# Patient Record
Sex: Female | Born: 1950 | Race: White | Hispanic: No | Marital: Married | Smoking: Never smoker
Health system: Southern US, Community
[De-identification: ages and names within clinical notes are randomized; demographics above are authoritative.]

## PROBLEM LIST (undated history)

## (undated) DIAGNOSIS — M1990 Unspecified osteoarthritis, unspecified site: Secondary | ICD-10-CM

## (undated) DIAGNOSIS — G5600 Carpal tunnel syndrome, unspecified upper limb: Secondary | ICD-10-CM

## (undated) DIAGNOSIS — H269 Unspecified cataract: Secondary | ICD-10-CM

## (undated) DIAGNOSIS — H9190 Unspecified hearing loss, unspecified ear: Secondary | ICD-10-CM

## (undated) DIAGNOSIS — T8859XA Other complications of anesthesia, initial encounter: Secondary | ICD-10-CM

## (undated) DIAGNOSIS — I10 Essential (primary) hypertension: Secondary | ICD-10-CM

## (undated) DIAGNOSIS — T4145XA Adverse effect of unspecified anesthetic, initial encounter: Secondary | ICD-10-CM

## (undated) DIAGNOSIS — R011 Cardiac murmur, unspecified: Secondary | ICD-10-CM

## (undated) DIAGNOSIS — E785 Hyperlipidemia, unspecified: Secondary | ICD-10-CM

## (undated) DIAGNOSIS — R51 Headache: Secondary | ICD-10-CM

## (undated) DIAGNOSIS — T7840XA Allergy, unspecified, initial encounter: Secondary | ICD-10-CM

## (undated) DIAGNOSIS — R519 Headache, unspecified: Secondary | ICD-10-CM

## (undated) DIAGNOSIS — J302 Other seasonal allergic rhinitis: Secondary | ICD-10-CM

## (undated) HISTORY — PX: TONSILLECTOMY: SUR1361

## (undated) HISTORY — DX: Hyperlipidemia, unspecified: E78.5

## (undated) HISTORY — DX: Allergy, unspecified, initial encounter: T78.40XA

## (undated) HISTORY — DX: Cardiac murmur, unspecified: R01.1

## (undated) HISTORY — PX: COLONOSCOPY: SHX174

## (undated) HISTORY — PX: I & D EXTREMITY: SHX5045

## (undated) HISTORY — PX: TUBAL LIGATION: SHX77

## (undated) HISTORY — PX: JOINT REPLACEMENT: SHX530

---
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
## 2001-01-26 ENCOUNTER — Encounter: Payer: Self-pay | Admitting: Family Medicine

## 2001-01-26 ENCOUNTER — Encounter: Admission: RE | Admit: 2001-01-26 | Discharge: 2001-01-26 | Payer: Self-pay | Admitting: Family Medicine

## 2002-09-07 ENCOUNTER — Ambulatory Visit (HOSPITAL_COMMUNITY): Admission: RE | Admit: 2002-09-07 | Discharge: 2002-09-07 | Payer: Self-pay | Admitting: Gastroenterology

## 2002-11-26 ENCOUNTER — Encounter: Admission: RE | Admit: 2002-11-26 | Discharge: 2002-11-26 | Payer: Self-pay | Admitting: Family Medicine

## 2002-11-26 ENCOUNTER — Encounter: Payer: Self-pay | Admitting: Family Medicine

## 2006-08-30 ENCOUNTER — Encounter: Admission: RE | Admit: 2006-08-30 | Discharge: 2006-08-30 | Payer: Self-pay | Admitting: Family Medicine

## 2008-05-08 ENCOUNTER — Encounter: Admission: RE | Admit: 2008-05-08 | Discharge: 2008-05-08 | Payer: Self-pay | Admitting: Family Medicine

## 2009-04-26 HISTORY — PX: BUNIONECTOMY: SHX129

## 2009-05-05 ENCOUNTER — Ambulatory Visit (HOSPITAL_COMMUNITY): Admission: RE | Admit: 2009-05-05 | Discharge: 2009-05-05 | Payer: Self-pay | Admitting: Family Medicine

## 2009-07-27 ENCOUNTER — Emergency Department (HOSPITAL_COMMUNITY): Admission: EM | Admit: 2009-07-27 | Discharge: 2009-07-27 | Payer: Self-pay | Admitting: Emergency Medicine

## 2010-09-11 NOTE — Op Note (Signed)
   NAME:  Summer Cooper, Summer Cooper                       ACCOUNT NO.:  [id number]   MEDICAL RECORD NO.:  [id number]                   PATIENT TYPE:  AMB   LOCATION:  ENDO                                 FACILITY:  [facility]   PHYSICIAN:  Anselmo Rod, M.D.               DATE OF BIRTH:  09/12/1950   DATE OF PROCEDURE:  09/07/2002  DATE OF DISCHARGE:                                 OPERATIVE REPORT   PROCEDURE:  Screening colonoscopy.   ENDOSCOPIST:  Anselmo Rod, M.D.   INSTRUMENT USED:  Olympus video colonoscope.   INDICATIONS FOR PROCEDURE:  This is a 60 year old white female who is  undergoing screening colonoscopy.  The patient has a history of constipation  and very irregular, occasional __________ bowel movement.  Rule out polyps,  hemorrhoids, etc.   CONSENT/PREPARATION:  Informed consent was procured from the patient.  The  patient fasted for eight hours prior to the procedure and prepped with a  bottle of magnesium citrate and a gallon of GoLYTELY the night prior to the  procedure.   PREPROCEDURE PHYSICAL EXAMINATION:  VITAL SIGNS:  The patient had stable  vital signs.  NECK:  Supple.  CHEST:  Clear to auscultation.  HEART:  S1 and S2 regular.  ABDOMEN:  Soft with normal bowel sounds.   DESCRIPTION OF PROCEDURE:  The patient was placed in the left lateral  decubitus position, sedated with 70 mg of Demerol and 7 mg of Versed  intravenously.  Once the patient was adequately sedated and maintained on  low flow oxygen, continuous cardiac monitoring, the Olympus video  colonoscope was advanced from the rectum to the cecum and terminal ileum  without difficulty.  The appendiceal orifice and ileocecal valve appeared  healthy.  No masses, polyps, erosions, ulcerations or diverticula were seen.  Retroflexion in the rectum revealed no masses.   IMPRESSION:  Normal colonoscopy.   RECOMMENDATIONS:  1. Repeat colorectal screening is recommended in the next five years unless     the  patient has any abnormal     symptoms.  2. High fiber diet with liberal fluid intake has been added, 22-25 g of     fiber is recommended in the diet.  3. Outpatient followup on a p.r.n. basis.                                               Anselmo Rod, M.D.    JNM/MEDQ  D:  09/07/2002  T:  09/07/2002  Job:  [id number]   cc:   Mosetta Putt, M.D.  [address] [place]  [place] [address]  Fax: [phone]

## 2011-05-12 ENCOUNTER — Other Ambulatory Visit: Payer: Self-pay

## 2011-05-12 ENCOUNTER — Encounter (HOSPITAL_BASED_OUTPATIENT_CLINIC_OR_DEPARTMENT_OTHER)
Admission: RE | Admit: 2011-05-12 | Discharge: 2011-05-12 | Disposition: A | Discharge status: Home / Self Care | Payer: 59 | Source: Ambulatory Visit | Attending: Orthopedic Surgery | Admitting: Orthopedic Surgery

## 2011-05-12 ENCOUNTER — Encounter (HOSPITAL_BASED_OUTPATIENT_CLINIC_OR_DEPARTMENT_OTHER): Payer: Self-pay | Admitting: *Deleted

## 2011-05-12 LAB — BASIC METABOLIC PANEL
BUN: 23 mg/dL (ref 6–23)
Chloride: 97 mEq/L (ref 96–112)
GFR calc Af Amer: >90 mL/min (ref >90)
Glucose, Bld: 142 mg/dL — ABNORMAL HIGH (ref 70–99)
Potassium: 4 mEq/L (ref 3.5–5.1)
Sodium: 134 mEq/L — ABNORMAL LOW (ref 135–145)

## 2011-05-12 NOTE — Progress Notes (Signed)
ekg and bmet done-bring all meds-overnight bag

## 2011-05-13 ENCOUNTER — Ambulatory Visit (HOSPITAL_BASED_OUTPATIENT_CLINIC_OR_DEPARTMENT_OTHER): Payer: 59 | Admitting: *Deleted

## 2011-05-13 ENCOUNTER — Encounter (HOSPITAL_BASED_OUTPATIENT_CLINIC_OR_DEPARTMENT_OTHER): Payer: Self-pay | Admitting: *Deleted

## 2011-05-13 ENCOUNTER — Ambulatory Visit (HOSPITAL_BASED_OUTPATIENT_CLINIC_OR_DEPARTMENT_OTHER)
Admission: RE | Admit: 2011-05-13 | Discharge: 2011-05-13 | Disposition: A | Discharge status: Home / Self Care | Payer: 59 | Source: Ambulatory Visit | Attending: Orthopedic Surgery | Admitting: Orthopedic Surgery

## 2011-05-13 ENCOUNTER — Encounter (HOSPITAL_BASED_OUTPATIENT_CLINIC_OR_DEPARTMENT_OTHER)
Admission: RE | Disposition: A | Discharge status: Home / Self Care | Payer: Self-pay | Source: Ambulatory Visit | Attending: Orthopedic Surgery

## 2011-05-13 DIAGNOSIS — Q66219 Congenital metatarsus primus varus, unspecified foot: Secondary | ICD-10-CM | POA: Insufficient documentation

## 2011-05-13 DIAGNOSIS — M21611 Bunion of right foot: Secondary | ICD-10-CM

## 2011-05-13 DIAGNOSIS — M2040 Other hammer toe(s) (acquired), unspecified foot: Secondary | ICD-10-CM | POA: Insufficient documentation

## 2011-05-13 DIAGNOSIS — M2010 Hallux valgus (acquired), unspecified foot: Secondary | ICD-10-CM | POA: Insufficient documentation

## 2011-05-13 DIAGNOSIS — Z01812 Encounter for preprocedural laboratory examination: Secondary | ICD-10-CM | POA: Insufficient documentation

## 2011-05-13 HISTORY — DX: Unspecified osteoarthritis, unspecified site: M19.90

## 2011-05-13 HISTORY — PX: TARSAL METATARSAL ARTHRODESIS: SHX2481

## 2011-05-13 HISTORY — DX: Adverse effect of unspecified anesthetic, initial encounter: T41.45XA

## 2011-05-13 HISTORY — DX: Other complications of anesthesia, initial encounter: T88.59XA

## 2011-05-13 HISTORY — DX: Other seasonal allergic rhinitis: J30.2

## 2011-05-13 HISTORY — DX: Essential (primary) hypertension: I10

## 2011-05-13 SURGERY — FUSION, TARSOMETATARSAL JOINT
Anesthesia: General | Site: Foot | Laterality: Right | Wound class: Clean

## 2011-05-13 MED ORDER — OXYCODONE HCL 5 MG PO TABS: 5.0000 mg | ORAL_TABLET | ORAL | Status: AC | PRN

## 2011-05-13 MED ORDER — PROPOFOL 10 MG/ML IV EMUL
INTRAVENOUS | Status: DC | PRN
  Administered 2011-05-13: 200 mg via INTRAVENOUS
  Administered 2011-05-13: 30 mg via INTRAVENOUS

## 2011-05-13 MED ORDER — FENTANYL CITRATE 0.05 MG/ML IJ SOLN
INTRAMUSCULAR | Status: DC | PRN
  Administered 2011-05-13: 50 ug via INTRAVENOUS

## 2011-05-13 MED ORDER — METOCLOPRAMIDE HCL 5 MG/ML IJ SOLN
INTRAMUSCULAR | Status: DC | PRN
  Administered 2011-05-13: 10 mg via INTRAVENOUS

## 2011-05-13 MED ORDER — LIDOCAINE HCL 1 % IJ SOLN
INTRAMUSCULAR | Status: DC | PRN
  Administered 2011-05-13: 2 mL via INTRADERMAL

## 2011-05-13 MED ORDER — FENTANYL CITRATE 0.05 MG/ML IJ SOLN
25.0000 ug | INTRAMUSCULAR | Status: DC | PRN
  Administered 2011-05-13: 50 ug via INTRAVENOUS

## 2011-05-13 MED ORDER — SODIUM CHLORIDE 0.9 % IV SOLN: INTRAVENOUS | Status: DC

## 2011-05-13 MED ORDER — MIDAZOLAM HCL 2 MG/2ML IJ SOLN
0.5000 mg | INTRAMUSCULAR | Status: DC | PRN
  Administered 2011-05-13: 2 mg via INTRAVENOUS

## 2011-05-13 MED ORDER — FENTANYL CITRATE 0.05 MG/ML IJ SOLN
50.0000 ug | INTRAMUSCULAR | Status: DC | PRN
  Administered 2011-05-13: 100 ug via INTRAVENOUS

## 2011-05-13 MED ORDER — CEFAZOLIN SODIUM 1-5 GM-% IV SOLN
1.0000 g | INTRAVENOUS | Status: AC
  Administered 2011-05-13: 1 g via INTRAVENOUS

## 2011-05-13 MED ORDER — LIDOCAINE HCL (CARDIAC) 20 MG/ML IV SOLN
INTRAVENOUS | Status: DC | PRN
  Administered 2011-05-13: 40 mg via INTRAVENOUS

## 2011-05-13 MED ORDER — METOCLOPRAMIDE HCL 5 MG/ML IJ SOLN: 10.0000 mg | Freq: Once | INTRAMUSCULAR | Status: DC | PRN

## 2011-05-13 MED ORDER — LACTATED RINGERS IV SOLN
INTRAVENOUS | Status: DC
  Administered 2011-05-13 (×2): via INTRAVENOUS

## 2011-05-13 MED ORDER — EPHEDRINE SULFATE 50 MG/ML IJ SOLN
INTRAMUSCULAR | Status: DC | PRN
  Administered 2011-05-13 (×2): 5 mg via INTRAVENOUS

## 2011-05-13 MED ORDER — ONDANSETRON HCL 4 MG/2ML IJ SOLN
INTRAMUSCULAR | Status: DC | PRN
  Administered 2011-05-13: 4 mg via INTRAVENOUS

## 2011-05-13 MED ORDER — MORPHINE SULFATE 2 MG/ML IJ SOLN: 0.0500 mg/kg | INTRAMUSCULAR | Status: DC | PRN

## 2011-05-13 MED ORDER — LIDOCAINE HCL 1.5 % IJ SOLN
INTRAMUSCULAR | Status: DC | PRN
  Administered 2011-05-13: 20 mL via INTRADERMAL

## 2011-05-13 MED ORDER — DEXAMETHASONE SODIUM PHOSPHATE 4 MG/ML IJ SOLN
INTRAMUSCULAR | Status: DC | PRN
  Administered 2011-05-13: 10 mg via INTRAVENOUS

## 2011-05-13 MED ORDER — ROPIVACAINE HCL 5 MG/ML IJ SOLN
INTRAMUSCULAR | Status: DC | PRN
  Administered 2011-05-13: 20 mL via EPIDURAL

## 2011-05-13 SURGICAL SUPPLY — 100 items
BAG DECANTER FOR FLEXI CONT (MISCELLANEOUS) IMPLANT
BANDAGE CONFORM 3  STR LF (GAUZE/BANDAGES/DRESSINGS) ×2 IMPLANT
BANDAGE ESMARK 6X9 LF (GAUZE/BANDAGES/DRESSINGS) IMPLANT
BANDAGE GAUZE 4  KLING STR (GAUZE/BANDAGES/DRESSINGS) IMPLANT
BANDAGE GAUZE ELAST BULKY 4 IN (GAUZE/BANDAGES/DRESSINGS) IMPLANT
BIT DRILL 100X2XQC STRL (BIT) ×1 IMPLANT
BIT DRILL 2.5X2.75 QC CALB (BIT) ×2 IMPLANT
BIT DRILL 3.5X5.5 QC CALB (BIT) ×2 IMPLANT
BIT DRILL PROS QC 1.5 (BIT) ×2 IMPLANT
BIT DRILL QC 2.0X100 (BIT) ×1
BIT DRL 100X2XQC STRL (BIT) ×1
BLADE AVERAGE 25X9 (BLADE) ×2 IMPLANT
BLADE MINI RND TIP GREEN BEAV (BLADE) IMPLANT
BLADE OSC/SAG .038X5.5 CUT EDG (BLADE) ×2 IMPLANT
BLADE SURG 15 STRL LF DISP TIS (BLADE) ×3 IMPLANT
BLADE SURG 15 STRL SS (BLADE) ×3
BNDG COHESIVE 4X5 TAN STRL (GAUZE/BANDAGES/DRESSINGS) ×4 IMPLANT
BNDG COHESIVE 6X5 TAN STRL LF (GAUZE/BANDAGES/DRESSINGS) ×2 IMPLANT
BNDG ESMARK 4X9 LF (GAUZE/BANDAGES/DRESSINGS) IMPLANT
BNDG ESMARK 6X9 LF (GAUZE/BANDAGES/DRESSINGS)
BUR EGG ELITE 4.0 (BURR) ×2 IMPLANT
CAP PIN ORTHO PINK (CAP) ×2 IMPLANT
CAP PIN PROTECTOR ORTHO WHT (CAP) ×2 IMPLANT
CHLORAPREP W/TINT 26ML (MISCELLANEOUS) ×2 IMPLANT
CLOTH BEACON ORANGE TIMEOUT ST (SAFETY) ×2 IMPLANT
COVER MAYO STAND STRL (DRAPES) IMPLANT
COVER TABLE BACK 60X90 (DRAPES) ×2 IMPLANT
CUFF TOURNIQUET SINGLE 18IN (TOURNIQUET CUFF) IMPLANT
CUFF TOURNIQUET SINGLE 34IN LL (TOURNIQUET CUFF) ×2 IMPLANT
DECANTER SPIKE VIAL GLASS SM (MISCELLANEOUS) IMPLANT
DRAPE EXTREMITY T 121X128X90 (DRAPE) ×2 IMPLANT
DRAPE INCISE IOBAN 66X45 STRL (DRAPES) IMPLANT
DRAPE OEC MINIVIEW 54X84 (DRAPES) ×2 IMPLANT
DRAPE SURG 17X23 STRL (DRAPES) IMPLANT
DRAPE U-SHAPE 47X51 STRL (DRAPES) ×2 IMPLANT
DRAPE U-SHAPE 76X120 STRL (DRAPES) IMPLANT
DRSG EMULSION OIL 3X3 NADH (GAUZE/BANDAGES/DRESSINGS) ×2 IMPLANT
DRSG PAD ABDOMINAL 8X10 ST (GAUZE/BANDAGES/DRESSINGS) ×4 IMPLANT
DRSG TEGADERM 2-3/8X2-3/4 SM (GAUZE/BANDAGES/DRESSINGS) IMPLANT
ELECT REM PT RETURN 9FT ADLT (ELECTROSURGICAL) ×2
ELECTRODE REM PT RTRN 9FT ADLT (ELECTROSURGICAL) ×1 IMPLANT
GAUZE SPONGE 4X4 16PLY XRAY LF (GAUZE/BANDAGES/DRESSINGS) IMPLANT
GAUZE XEROFORM 1X8 LF (GAUZE/BANDAGES/DRESSINGS) IMPLANT
GLOVE BIO SURGEON STRL SZ8 (GLOVE) ×2 IMPLANT
GLOVE BIOGEL PI IND STRL 8 (GLOVE) ×2 IMPLANT
GLOVE BIOGEL PI INDICATOR 8 (GLOVE) ×2
GOWN PREVENTION PLUS XLARGE (GOWN DISPOSABLE) ×2 IMPLANT
GOWN PREVENTION PLUS XXLARGE (GOWN DISPOSABLE) ×2 IMPLANT
K-WIRE 102X1.4 (WIRE) ×2 IMPLANT
K-WIRE DBL END .054 LG (WIRE) ×2 IMPLANT
K-WIRE SGLE END .054 SHORT (WIRE) ×4 IMPLANT
NDL SAFETY ECLIPSE 18X1.5 (NEEDLE) IMPLANT
NEEDLE HYPO 18GX1.5 SHARP (NEEDLE)
NEEDLE HYPO 22GX1.5 SAFETY (NEEDLE) ×2 IMPLANT
NEEDLE HYPO 25X1 1.5 SAFETY (NEEDLE) IMPLANT
NS IRRIG 1000ML POUR BTL (IV SOLUTION) ×2 IMPLANT
PACK BASIN DAY SURGERY FS (CUSTOM PROCEDURE TRAY) ×2 IMPLANT
PAD CAST 4YDX4 CTTN HI CHSV (CAST SUPPLIES) ×1 IMPLANT
PADDING CAST ABS 4INX4YD NS (CAST SUPPLIES) ×1
PADDING CAST ABS COTTON 4X4 ST (CAST SUPPLIES) ×1 IMPLANT
PADDING CAST COTTON 4X4 STRL (CAST SUPPLIES) ×1
PADDING CAST COTTON 6X4 STRL (CAST SUPPLIES) ×2 IMPLANT
PENCIL BUTTON HOLSTER BLD 10FT (ELECTRODE) ×2 IMPLANT
SCREW CORTEX ST 2.0X14 (Screw) ×2 IMPLANT
SCREW CORTICAL 3.5MM  34MM (Screw) ×1 IMPLANT
SCREW CORTICAL 3.5MM 34MM (Screw) ×1 IMPLANT
SCREW CORTICAL 3.5MM 38MM (Screw) ×2 IMPLANT
SHEET MEDIUM DRAPE 40X70 STRL (DRAPES) ×2 IMPLANT
SPLINT FAST PLASTER 5X30 (CAST SUPPLIES)
SPLINT PLASTER CAST FAST 5X30 (CAST SUPPLIES) IMPLANT
SPONGE GAUZE 4X4 12PLY (GAUZE/BANDAGES/DRESSINGS) ×4 IMPLANT
SPONGE LAP 18X18 X RAY DECT (DISPOSABLE) ×2 IMPLANT
STAPLER VISISTAT 35W (STAPLE) IMPLANT
STOCKINETTE 6  STRL (DRAPES) ×1
STOCKINETTE 6 STRL (DRAPES) ×1 IMPLANT
STRIP CLOSURE SKIN 1/2X4 (GAUZE/BANDAGES/DRESSINGS) IMPLANT
SUCTION FRAZIER TIP 10 FR DISP (SUCTIONS) ×2 IMPLANT
SUT ETHILON 3 0 PS 1 (SUTURE) IMPLANT
SUT ETHILON 4 0 PS 2 18 (SUTURE) IMPLANT
SUT MNCRL AB 3-0 PS2 18 (SUTURE) ×2 IMPLANT
SUT MNCRL AB 4-0 PS2 18 (SUTURE) IMPLANT
SUT PROLENE 3 0 PS 1 (SUTURE) ×4 IMPLANT
SUT PROLENE 3 0 PS 2 (SUTURE) IMPLANT
SUT VIC AB 0 CT1 18XCR BRD 8 (SUTURE) IMPLANT
SUT VIC AB 0 CT1 8-18 (SUTURE)
SUT VIC AB 0 SH 27 (SUTURE) IMPLANT
SUT VIC AB 2-0 PS2 27 (SUTURE) IMPLANT
SUT VIC AB 2-0 SH 18 (SUTURE) IMPLANT
SUT VIC AB 2-0 SH 27 (SUTURE)
SUT VIC AB 2-0 SH 27XBRD (SUTURE) IMPLANT
SUT VIC AB 3-0 PS1 18 (SUTURE)
SUT VIC AB 3-0 PS1 18XBRD (SUTURE) IMPLANT
SUT VICRYL 4-0 PS2 18IN ABS (SUTURE) IMPLANT
SYR BULB 3OZ (MISCELLANEOUS) ×2 IMPLANT
SYR CONTROL 10ML LL (SYRINGE) IMPLANT
TOWEL OR 17X24 6PK STRL BLUE (TOWEL DISPOSABLE) ×2 IMPLANT
TUBE CONNECTING 20X1/4 (TUBING) IMPLANT
UNDERPAD 30X30 INCONTINENT (UNDERPADS AND DIAPERS) ×2 IMPLANT
WATER STERILE IRR 1000ML POUR (IV SOLUTION) ×2 IMPLANT
YANKAUER SUCT BULB TIP NO VENT (SUCTIONS) IMPLANT

## 2011-05-13 NOTE — Anesthesia Procedure Notes (Addendum)
Anesthesia Regional Block:  Popliteal block  Pre-Anesthetic Checklist: ,, timeout performed, Correct Patient, Correct Site, Correct Laterality, Correct Procedure, Correct Position, site marked, Risks and benefits discussed,  Surgical consent,  Pre-op evaluation,  At surgeon's request and post-op pain management  Laterality: Right  Prep: chloraprep       Needles:   Needle Type: Other   (Arrow Echogenic)   Needle Length: 9cm  Needle Gauge: 21    Additional Needles:  Procedures: ultrasound guided Popliteal block Narrative:  Start time: 05/13/2011 7:15 AM End time: 05/13/2011 7:23 AM Injection made incrementally with aspirations every 5 mL.  Performed by: Personally  Anesthesiologist: Aldona Lento, MD  Additional Notes: Ultrasound guidance used to: id relevant anatomy, confirm needle position, local anesthetic spread, avoidance of vascular puncture. Picture saved. No complications. Block performed personally by Janetta Hora. Gelene Mink, MD  .    Popliteal block Procedure Name: LMA Insertion Date/Time: 05/13/2011 7:36 AM Performed by: Meyer Russel Pre-anesthesia Checklist: Patient identified, Emergency Drugs available, Suction available, Patient being monitored and Timeout performed Patient Re-evaluated:Patient Re-evaluated prior to inductionOxygen Delivery Method: Circle System Utilized Preoxygenation: Pre-oxygenation with 100% oxygen Intubation Type: IV induction Ventilation: Mask ventilation without difficulty LMA: LMA inserted LMA Size: 3.0 Number of attempts: 1 Airway Equipment and Method: bite block Placement Confirmation: positive ETCO2 and breath sounds checked- equal and bilateral Tube secured with: Tape Dental Injury: Teeth and Oropharynx as per pre-operative assessment

## 2011-05-13 NOTE — Anesthesia Postprocedure Evaluation (Signed)
Anesthesia Post Note  Patient: Summer Cooper  Procedure(s) Performed:  TARSAL METATARSAL FUSION - right Lapidus 1st tarsal metatarsal arthrodesis, right 2nd hammer toe correction, mcbride bunionectomy, 2nd metatarsal weil osteotomy; BUNIONECTOMY WITH WEIL OSTEOTOMY  Anesthesia type: General  Patient location: PACU  Post pain: Pain level controlled  Post assessment: Patient's Cardiovascular Status Stable  Last Vitals:  Filed Vitals:   05/13/11 1015  BP: 104/47  Pulse: 96  Temp:   Resp: 15    Post vital signs: Reviewed and stable  Level of consciousness: alert  Complications: No apparent anesthesia complications

## 2011-05-13 NOTE — Progress Notes (Signed)
Assisted Dr. Frederick with right, ultrasound guided, popliteal/saphenous block. Side rails up, monitors on throughout procedure. See vital signs in flow sheet. Tolerated Procedure well. 

## 2011-05-13 NOTE — Brief Op Note (Signed)
05/13/2011  9:38 AM  PATIENT:  Summer Cooper  61 y.o. female  PRE-OPERATIVE DIAGNOSIS:  right hallux valgus, metatarsus primus varus, 2nd hammer toe  POST-OPERATIVE DIAGNOSIS:  right hallux valgus, metatarsus primus varus, 2nd hammer toe  Procedure(s): 1.  Right 1st TMT arthrodesis (Lapidus) 2.  Right modified McBride bunionectomy 3.  Right 2nd MT Weil osteotomy 4.  Right 2nd MTP joint dorsal capsulotomy 5.  Right 2nd hammertoe correction (PIP arthrodesis) 6.  fluoro  SURGEON:  Toni Arthurs, MD  ASSISTANT: n/a  ANESTHESIA:   General, regional  EBL:  minimal   TOURNIQUET:   Total Tourniquet Time Documented: Thigh (Right) - 96 minutes  COMPLICATIONS:  None apparent  DISPOSITION:  Extubated, awake and stable to recovery.  DICTATION ID:  [id number]

## 2011-05-13 NOTE — Transfer of Care (Signed)
Immediate Anesthesia Transfer of Care Note  Patient: Summer Cooper  Procedure(s) Performed:  TARSAL METATARSAL FUSION - right Lapidus 1st tarsal metatarsal arthrodesis, right 2nd hammer toe correction, mcbride bunionectomy, 2nd metatarsal weil osteotomy; BUNIONECTOMY WITH WEIL OSTEOTOMY  Patient Location: PACU  Anesthesia Type: General and GA combined with regional for post-op pain  Level of Consciousness: awake, oriented and patient cooperative  Airway & Oxygen Therapy: Patient Spontanous Breathing and Patient connected to face mask oxygen  Post-op Assessment: Report given to PACU RN and Post -op Vital signs reviewed and stable  Post vital signs: Reviewed and stable Filed Vitals:   05/13/11 0723  BP: 116/49  Pulse: 74  Temp:   Resp: 12    Complications: No apparent anesthesia complications

## 2011-05-13 NOTE — H&P (Signed)
Summer Cooper is an 61 y.o. female.   Chief Complaint: right foot bunion and 2nd hammertoe HPI: 61 y/o woman without significant PMH presents for correction of painful right bunion and 2nd hammertoe.  No recent changes in her health.  Past Medical History  Diagnosis Date  . Complication of anesthesia     hard to wake up  . Hypertension   . Seasonal allergies   . Arthritis     Past Surgical History  Procedure Date  . Tubal ligation   . Tonsillectomy   . Colonoscopy   . I&d extremity     cat bite-rt wrist    History reviewed. No pertinent family history. Social History:  reports that she has never smoked. She does not have any smokeless tobacco history on file. She reports that she drinks alcohol. She reports that she does not use illicit drugs.  Allergies: No Known Allergies  Medications Prior to Admission  Medication Dose Route Frequency Provider Last Rate Last Dose  . 0.9 %  sodium chloride infusion   Intravenous Continuous Toni Arthurs, MD      . ceFAZolin (ANCEF) IVPB 1 g/50 mL premix  1 g Intravenous 60 min Pre-Op Toni Arthurs, MD      . fentaNYL (SUBLIMAZE) injection 50-100 mcg  50-100 mcg Intravenous PRN Constance Goltz, MD   100 mcg at 05/13/11 0708  . lactated ringers infusion   Intravenous Continuous Germaine Pomfret, MD 20 mL/hr at 05/13/11 [phone]    . midazolam (VERSED) injection 0.5-2 mg  0.5-2 mg Intravenous PRN Constance Goltz, MD   2 mg at 05/13/11 0708   Medications Prior to Admission  Medication Sig Dispense Refill  . celecoxib (CELEBREX) 100 MG capsule Take 100 mg by mouth 1 day or 1 dose.      . fexofenadine (ALLEGRA) 180 MG tablet Take 180 mg by mouth daily.      Marland Kitchen lisinopril-hydrochlorothiazide (PRINZIDE,ZESTORETIC) 10-12.5 MG per tablet Take 1 tablet by mouth daily.      . multivitamin (THERAGRAN) per tablet Take 1 tablet by mouth daily.        Results for orders placed during the hospital encounter of 05/13/11 (from the past 48  hour(s))  BASIC METABOLIC PANEL     Status: Abnormal   Collection Time   05/12/11  2:56 PM      Component Value Range Comment   Sodium 134 (*) 135 - 145 (mEq/L)    Potassium 4.0  3.5 - 5.1 (mEq/L)    Chloride 97  96 - 112 (mEq/L)    CO2 28  19 - 32 (mEq/L)    Glucose, Bld 142 (*) 70 - 99 (mg/dL)    BUN 23  6 - 23 (mg/dL)    Creatinine, Ser 9.60  0.50 - 1.10 (mg/dL)    Calcium 9.8  8.4 - 10.5 (mg/dL)    GFR calc non Af Amer 88 (*) >90 (mL/min)    GFR calc Af Amer >90  >90 (mL/min)   POCT HEMOGLOBIN-HEMACUE     Status: Normal   Collection Time   05/13/11  6:53 AM      Component Value Range Comment   Hemoglobin 12.4  12.0 - 15.0 (g/dL)    No results found.  ROS  No recent f/c/n/v/wt loss.  Blood pressure 130/54, pulse 96, temperature 98.6 F (37 C), temperature source Oral, resp. rate 23, height 5\' 2"  (1.575 m), weight 56.246 kg (124 lb), SpO2 100.00%. Physical Exam wn wd woman  in nad.  A and O x 4.  Mood and affect normal.  EOMI.  Respirations unlabored.  R foot with significant hallux valgus deformity and crossover 2nd hammertoe deformity.  Skin healthy and intact.  No lymphadenopathy.  5/5 strength in PF and DF of toes.  Sens to LT intact in R foot.  Assessment/Plan R hallux valgus and 2nd hammertoe deformities.  To OR today for operative treatment of these conditions.  The risks and benefits of the alternative treatment options have been discussed in detail.  The patient wishes to proceed with surgery and specifically understands risks of bleeding, infection, nerve damage, blood clots, need for additional surgery, amputation and death.   Toni Arthurs 2011-05-30, 7:13 AM

## 2011-05-13 NOTE — Anesthesia Preprocedure Evaluation (Addendum)
Anesthesia Evaluation  Patient identified by MRN, date of birth, ID band Patient awake    Reviewed: Allergy & Precautions, H&P , NPO status , Patient's Chart, lab work & pertinent test results, reviewed documented beta blocker date and time   History of Anesthesia Complications (+) PONV  Airway Mallampati: II TM Distance: >3 FB Neck ROM: full    Dental   Pulmonary neg pulmonary ROS,          Cardiovascular hypertension, On Medications     Neuro/Psych Negative Neurological ROS  Negative Psych ROS   GI/Hepatic negative GI ROS, Neg liver ROS,   Endo/Other  Negative Endocrine ROS  Renal/GU negative Renal ROS  Genitourinary negative   Musculoskeletal   Abdominal   Peds  Hematology negative hematology ROS (+)   Anesthesia Other Findings See surgeon's H&P   Reproductive/Obstetrics negative OB ROS                           Anesthesia Physical Anesthesia Plan  ASA: II  Anesthesia Plan: General   Post-op Pain Management: MAC Combined w/ Regional for Post-op pain   Induction: Intravenous  Airway Management Planned: LMA  Additional Equipment:   Intra-op Plan:   Post-operative Plan: Extubation in OR  Informed Consent: I have reviewed the patients History and Physical, chart, labs and discussed the procedure including the risks, benefits and alternatives for the proposed anesthesia with the patient or authorized representative who has indicated his/her understanding and acceptance.   Dental Advisory Given  Plan Discussed with: CRNA and Surgeon  Anesthesia Plan Comments:       Anesthesia Quick Evaluation

## 2011-05-13 NOTE — Op Note (Signed)
NAME:  Summer Cooper, Summer Cooper NO.:  MEDICAL RECORD NO.:  [id number]  LOCATION:                                 FACILITY:  PHYSICIAN:  Toni Arthurs, MD             DATE OF BIRTH:  DATE OF PROCEDURE:  05/13/2011 DATE OF DISCHARGE:                              OPERATIVE REPORT   PREOPERATIVE DIAGNOSES: 1. Right hallux valgus deformity. 2. Right foot metatarsus primus varus. 3. Right second hammertoe deformity.  POSTOPERATIVE DIAGNOSES: 1. Right hallux valgus deformity. 2. Right foot metatarsus primus varus. 3. Right second hammertoe deformity with dorsal metatarsophalangeal     joint capsular contracture.  PROCEDURE: 1. Right first tarsometatarsal arthrodesis (Lapidus). 2. Right modified McBride bunionectomy. 3. Right second metatarsal Weil osteotomy. 4. Right second MTP joint dorsal capsulotomy. 5. Right second hammertoe correction (PIP arthrodesis). 6. Intraoperative interpretation of fluoroscopic imaging.  SURGEON:  Toni Arthurs, MD  ANESTHESIA:  General, regional.  IV FLUIDS:  See anesthesia record.  ESTIMATED BLOOD LOSS:  Minimal.  TOURNIQUET TIME:  96 minutes at 250 mmHg.  COMPLICATIONS:  None apparent.  DISPOSITION:  Extubated awake and stable to recovery.  INDICATIONS FOR PROCEDURE:  The patient is a 61 year old female with a long history of a painful right bunion deformity and a right second hammertoe deformity.  She has very narrow first metatarsal.  She presents now for operative treatment of these conditions.  She understands the risks and benefits, the alternative treatment options, and elects for surgical treatment.  Specifically she understands risks of bleeding, infection, nerve damage, blood clots, need for additional surgery, amputation, and death.  PROCEDURE IN DETAIL:  After preoperative consent was obtained, the correct operative site was identified.  The patient was brought to the operating room and placed supine on the  operating table.  General anesthesia was induced.  Preoperative antibiotics were administered. Surgical time-out was taken.  The right lower extremity was prepped and draped in standard sterile fashion with the tourniquet around the thigh. Longitudinal incision was marked on the dorsum of the first metatarsal over the first TMT joint curving down into the first web space.  A second incision was marked on the medial eminence.  The extremity was exsanguinated and tourniquet was inflated to 250 mmHg.  The dorsal incision was made.  Sharp dissection was carried down through the skin and subcutaneous tissue.  Blunt dissection was carried down into the first web space.  The intermetatarsal ligament was exposed.  It was incised releasing the lateral sesamoid from the second metatarsal.  The joint capsule between the lateral sesamoid and the first metatarsal head was then incised longitudinally.  The adductor hallucis tendon was left intact.  Several small stab incisions were made on the lateral joint capsule.  The hallux could then be passively corrected to about 20 degrees of varus.  The second MTP joint was then exposed dorsally.  The joint capsule was noted to be quite contracted.  The medial tendon was released on the medial side and a dorsal capsulotomy was made excising all the dorsal capsular tissue.  This allowed exposure of the second metatarsal head.  A  second metatarsal Weil osteotomy was created and the metatarsal head was allowed to retract proximally several mm.  It was held in place and a 2 mm fully-threaded cortical screw from the [employer] mini frag set was inserted in unicortical fashion securing the metatarsal head.  Overhanging articular cartilage was resected with a saw and smoothed with a rongeur.  At this point, second toe could be passively corrected except for the PIP fixed flexion contracture.  An ellipsoid incision was made over the second toe PIP joint.   Sharp dissection was carried down through the skin and dorsal joint capsule exposing the metatarsal head.  Collateral ligaments were released.  A sagittal saw was used to resect the head of the proximal phalanx at the level of subchondral bone.  The base of the middle phalanx was exposed and similarly resected with sagittal saw at the level of subchondral bone.  Joint was reduced.  A 0.054 K-wire was inserted through the tip of the toe and across the DIP and PIP joints.  The MP joint was reduced and the guide pin was passed across the MP joint into the metatarsal head and then up into the shaft.  Pin was bent, trimmed, and capped.  Attention was then turned to the medial longitudinal incision.  This was made and sharp dissection was carried down through the skin and subcutaneous tissue and then through the joint capsule.  The medial eminence was exposed.  It was resected with a sagittal saw at the level of the metatarsal shaft.  The cut edges were smoothed.  Attention was then turned to the dorsal aspect of the tarsometatarsal joint.  A longitudinal capsulotomy was made and the dorsal joint capsule was released medially and laterally exposing the tarsometatarsal joint. A sagittal saw was then used to make a osteotomy at the base of the first metatarsal perpendicular to the long axis of the first metatarsal shaft.  It was biased to take more plantar bone.  A second osteotomy was made at the distal articular surface of the medial cuneiform.  This was angled to take more lateral bone correcting the intermetatarsal angle. Both fragments of bone were removed.  A 2 mm drill bit was used to perforate the base of the first metatarsal and the distal aspect of the medial cuneiform after irrigating copiously.  The joint was reduced.  A K-wire was inserted across the joint to hold it in place.  A 4 mm oval bur was used to create a pocket in the dorsal cortex of the first metatarsal.  A 3.5 mm drill  bit was used to drill the base of the first metatarsal and a 2.5 bit was used to drill across into the medial cuneiform.  A 3.5 mm fully-threaded cortical screw was inserted in lag fashion compressing the joint.  AP and lateral fluoroscopic views showed appropriate position of this screw as well as appropriate length.  A second screw was then placed through a 2.5 mm drill hole from proximal to distal just lateral to the first screw.  The screw was also noted to have excellent purchase and the osteotomy site was noted to be well reduced, correcting the intermetatarsal angle appropriately.  AP and lateral fluoroscopic views confirmed appropriate position of both screws and appropriate reduction of the intermetatarsal angle and compression across the joint.  Attention was then turned to the medial joint capsule.  The sesamoids were noted to be in appropriate corrected position.  The medial joint capsule was repaired with imbricating sutures  of 0 Vicryl.  The skin incision was closed with 3-0 Prolene running sutures.  Dorsally the incision was closed with inverted and simple sutures of 3-0 Monocryl to the level of subcutaneous tissue and a running 3-0 Prolene at the skin incision.  The second toe incision was closed with horizontal mattress sutures of 3-0 Prolene.  Sterile dressings were applied followed by a compression dressing and a short-leg splint.  The patient was awakened by Anesthesia and transported to recovery room in stable condition.  The tourniquet had been released at 96 minutes after application of the dressings.  FOLLOWUP PLAN:  The patient will be nonweightbearing on the right lower extremity.  She will follow up with me in 2 weeks for suture removal and conversion to a cast.     Toni Arthurs, MD     JH/MEDQ  D:  05/13/2011  T:  05/13/2011  Job:  [id number]

## 2011-05-21 ENCOUNTER — Encounter: Payer: Self-pay | Admitting: *Deleted

## 2011-08-05 ENCOUNTER — Other Ambulatory Visit: Payer: Self-pay | Admitting: Family Medicine

## 2011-08-05 DIAGNOSIS — Z1231 Encounter for screening mammogram for malignant neoplasm of breast: Secondary | ICD-10-CM

## 2013-10-12 ENCOUNTER — Other Ambulatory Visit: Payer: Self-pay | Admitting: Family Medicine

## 2013-10-12 DIAGNOSIS — R1900 Intra-abdominal and pelvic swelling, mass and lump, unspecified site: Secondary | ICD-10-CM

## 2013-10-15 ENCOUNTER — Other Ambulatory Visit: Payer: 59

## 2013-10-16 ENCOUNTER — Ambulatory Visit
Admission: RE | Admit: 2013-10-16 | Discharge: 2013-10-16 | Disposition: A | Discharge status: Home / Self Care | Payer: Medicare Other | Source: Ambulatory Visit | Attending: Family Medicine | Admitting: Family Medicine

## 2013-10-16 DIAGNOSIS — R1900 Intra-abdominal and pelvic swelling, mass and lump, unspecified site: Secondary | ICD-10-CM

## 2013-10-16 MED ORDER — IOHEXOL 300 MG/ML  SOLN
100.0000 mL | Freq: Once | INTRAMUSCULAR | Status: AC | PRN
  Administered 2013-10-16: 100 mL via INTRAVENOUS

## 2015-01-30 ENCOUNTER — Other Ambulatory Visit: Payer: Self-pay | Admitting: Family Medicine

## 2015-01-30 DIAGNOSIS — R202 Paresthesia of skin: Secondary | ICD-10-CM

## 2015-02-06 ENCOUNTER — Ambulatory Visit
Admission: RE | Admit: 2015-02-06 | Discharge: 2015-02-06 | Disposition: A | Discharge status: Home / Self Care | Payer: Medicare Other | Source: Ambulatory Visit | Attending: Family Medicine | Admitting: Family Medicine

## 2015-02-06 DIAGNOSIS — R202 Paresthesia of skin: Secondary | ICD-10-CM

## 2015-02-20 ENCOUNTER — Other Ambulatory Visit: Payer: Self-pay | Admitting: Family Medicine

## 2015-02-20 ENCOUNTER — Other Ambulatory Visit (HOSPITAL_COMMUNITY)
Admission: RE | Admit: 2015-02-20 | Discharge: 2015-02-20 | Disposition: A | Discharge status: Home / Self Care | Payer: Medicare Other | Source: Ambulatory Visit | Attending: Family Medicine | Admitting: Family Medicine

## 2015-02-20 DIAGNOSIS — Z124 Encounter for screening for malignant neoplasm of cervix: Secondary | ICD-10-CM | POA: Diagnosis present

## 2015-02-25 LAB — CYTOLOGY - PAP

## 2017-05-20 NOTE — Pre-Procedure Instructions (Signed)
Summer PottCarolyn A O Brien  05/20/2017      [facility] [place] [place] [address] Phone: [phone] Fax: [phone]    Your procedure is scheduled on Friday, Feb. 1st   [facility] Admitting at 8:00 AM             (posted surgery time 10am - 1pm)   Call this number if you have problems the morning of surgery:  [phone]   Remember:              4-5 days prior to surgery, STOP TAKING any Vitamins, Anti-inflammatories, Herbal Supplements   Do not eat food or drink liquids after midnight, Thursday.   Take these medicines the morning of surgery with A SIP OF WATER : Zyrtec, Tramadol.  You may use your Flonase that morning.   Do not wear jewelry, make-up or nail polish.  Do not wear lotions, powders,  perfumes, or deodorant.  Do not shave 48 hours prior to surgery.    Do not bring valuables to the hospital.  [facility] is not responsible for any belongings or valuables.  Contacts, dentures or bridgework may not be worn into surgery.  Leave your suitcase in the car.  After surgery it may be brought to your room.  For patients admitted to the hospital, discharge time will be determined by your treatment team.  Please read over the following fact sheets that you were given. Pain Booklet, MRSA Information and Surgical Site Infection Prevention       - Preparing For Surgery  Before surgery, you can play an important role. Because skin is not sterile, your skin needs to be as free of germs as possible. You can reduce the number of germs on your skin by washing with CHG (chlorahexidine gluconate) Soap before surgery.  CHG is an antiseptic cleaner which kills germs and bonds with the skin to continue killing germs even after washing.  Please do not use if you have an allergy to CHG or antibacterial soaps. If your skin becomes reddened/irritated stop using the CHG.  Do not  shave (including legs and underarms) for at least 48 hours prior to first CHG shower. It is OK to shave your face.  Please follow these instructions carefully.   1. Shower the NIGHT BEFORE SURGERY and the MORNING OF SURGERY with CHG.   2. If you chose to wash your hair, wash your hair first as usual with your normal shampoo.  3. After you shampoo, rinse your hair and body thoroughly to remove the shampoo.  4. Use CHG as you would any other liquid soap. You can apply CHG directly to the skin and wash gently with a scrungie or a clean washcloth.   5. Apply the CHG Soap to your body ONLY FROM THE NECK DOWN.  Do not use on open wounds or open sores. Avoid contact with your eyes, ears, mouth and genitals (private parts). Wash Face and genitals (private parts)  with your normal soap.  6. Wash thoroughly, paying special attention to the area where your surgery will be performed.  7. Thoroughly rinse your body with warm water from the neck down.  8. DO NOT shower/wash with your normal soap after using and rinsing off the CHG Soap.  9. Pat yourself dry with a CLEAN TOWEL.  10. Wear CLEAN PAJAMAS to bed the night before surgery, wear comfortable clothes the  morning of surgery  11. Place CLEAN SHEETS on your bed the night of your first shower and DO NOT SLEEP WITH PETS.    Day of Surgery: Do not apply any deodorants/lotions. Please wear clean clothes to the hospital/surgery center.

## 2017-05-23 ENCOUNTER — Encounter (HOSPITAL_COMMUNITY): Payer: Self-pay

## 2017-05-23 ENCOUNTER — Other Ambulatory Visit (HOSPITAL_COMMUNITY): Payer: Medicare Other

## 2017-05-23 ENCOUNTER — Encounter (HOSPITAL_COMMUNITY)
Admission: RE | Admit: 2017-05-23 | Discharge: 2017-05-23 | Disposition: A | Discharge status: Home / Self Care | Payer: Medicare Other | Source: Ambulatory Visit | Attending: Orthopedic Surgery | Admitting: Orthopedic Surgery

## 2017-05-23 ENCOUNTER — Other Ambulatory Visit: Payer: Self-pay

## 2017-05-23 DIAGNOSIS — Z01812 Encounter for preprocedural laboratory examination: Secondary | ICD-10-CM | POA: Diagnosis not present

## 2017-05-23 DIAGNOSIS — M19011 Primary osteoarthritis, right shoulder: Secondary | ICD-10-CM | POA: Insufficient documentation

## 2017-05-23 HISTORY — DX: Unspecified hearing loss, unspecified ear: H91.90

## 2017-05-23 HISTORY — DX: Headache, unspecified: R51.9

## 2017-05-23 HISTORY — DX: Unspecified cataract: H26.9

## 2017-05-23 HISTORY — DX: Carpal tunnel syndrome, unspecified upper limb: G56.00

## 2017-05-23 HISTORY — DX: Headache: R51

## 2017-05-23 LAB — SURGICAL PCR SCREEN
MRSA, PCR: NEGATIVE
Staphylococcus aureus: POSITIVE — AB

## 2017-05-23 LAB — BASIC METABOLIC PANEL
ANION GAP: 12 (ref 5–15)
BUN: 20 mg/dL (ref 6–20)
CHLORIDE: 98 mmol/L — AB (ref 101–111)
CO2: 26 mmol/L (ref 22–32)
Calcium: 9.4 mg/dL (ref 8.9–10.3)
Creatinine, Ser: 0.87 mg/dL (ref 0.44–1.00)
GFR calc non Af Amer: >60 mL/min (ref >60)
Glucose, Bld: 91 mg/dL (ref 65–99)
POTASSIUM: 3.7 mmol/L (ref 3.5–5.1)
SODIUM: 136 mmol/L (ref 135–145)

## 2017-05-23 LAB — CBC
HEMATOCRIT: 39.8 % (ref 36.0–46.0)
HEMOGLOBIN: 12.8 g/dL (ref 12.0–15.0)
MCH: 31.6 pg (ref 26.0–34.0)
MCHC: 32.2 g/dL (ref 30.0–36.0)
MCV: 98.3 fL (ref 78.0–100.0)
PLATELETS: 306 10*3/uL (ref 150–400)
RBC: 4.05 MIL/uL (ref 3.87–5.11)
RDW: 13.5 % (ref 11.5–15.5)
WBC: 6.3 10*3/uL (ref 4.0–10.5)

## 2017-05-23 NOTE — Progress Notes (Signed)
PCP is Dr. Zenovia JordanKandace Cooper @ [facility]  LOV 03/2017.  She had EKG done there, am requesting that from office today. Denies any cardiac issues.  Denies murmur, has not seen a cardio.

## 2017-05-26 NOTE — Anesthesia Preprocedure Evaluation (Addendum)
Anesthesia Evaluation  Patient identified by MRN, date of birth, ID band Patient awake    Reviewed: Allergy & Precautions, H&P , NPO status , Patient's Chart, lab work & pertinent test results  Airway Mallampati: II  TM Distance: >3 FB Neck ROM: Full    Dental no notable dental hx. (+) Teeth Intact, Dental Advisory Given   Pulmonary neg pulmonary ROS,    Pulmonary exam normal breath sounds clear to auscultation       Cardiovascular Exercise Tolerance: Good hypertension, Pt. on medications  Rhythm:Regular Rate:Normal     Neuro/Psych  Headaches, negative neurological ROS  negative psych ROS   GI/Hepatic negative GI ROS, Neg liver ROS,   Endo/Other  negative endocrine ROS  Renal/GU negative Renal ROS  negative genitourinary   Musculoskeletal  (+) Arthritis , Osteoarthritis,    Abdominal   Peds  Hematology negative hematology ROS (+)   Anesthesia Other Findings   Reproductive/Obstetrics negative OB ROS                            Anesthesia Physical Anesthesia Plan  ASA: II  Anesthesia Plan: General   Post-op Pain Management:  Regional for Post-op pain   Induction: Intravenous  PONV Risk Score and Plan: 4 or greater and Ondansetron, Dexamethasone and Midazolam  Airway Management Planned: Oral ETT  Additional Equipment:   Intra-op Plan:   Post-operative Plan: Extubation in OR  Informed Consent: I have reviewed the patients History and Physical, chart, labs and discussed the procedure including the risks, benefits and alternatives for the proposed anesthesia with the patient or authorized representative who has indicated his/her understanding and acceptance.   Dental advisory given  Plan Discussed with: CRNA  Anesthesia Plan Comments:         Anesthesia Quick Evaluation

## 2017-05-26 NOTE — H&P (Signed)
Summer Cooper is an 67 y.o. female.    Chief Complaint: knee pain  Shoulder pain  HPI: Pt is a 67 y.o. female complaining of right shoulder pain for multiple years. Pain had continually increased since the beginning. X-rays in the clinic show end-stage arthritic changes of the right shoulder. Pt has tried various conservative treatments which have failed to alleviate their symptoms, including injections and therapy. Various options are discussed with the patient. Risks, benefits and expectations were discussed with the patient. Patient understand the risks, benefits and expectations and wishes to proceed with surgery.   PCP:  Merri Brunette, MD  D/C Plans: Home  PMH: Past Medical History:  Diagnosis Date  . Arthritis   . 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 Cataracts, bilateral    mild, no surgery yet  . Complication of anesthesia    hard to wake up (DURING TUBAL LIGATION.  OTHER SURGERIES WERE FINE)  . Headache    last flare up 04/06/2017  . Hearing loss    mild case - no aids used  . Hypertension   . Seasonal allergies     PSH: Past Surgical History:  Procedure Laterality Date  . COLONOSCOPY    . I&D EXTREMITY     cat bite-rt wrist  . TARSAL METATARSAL ARTHRODESIS  05/13/2011   Procedure: TARSAL METATARSAL FUSION;  Surgeon: Toni Arthurs, MD;  Location: [facility];  Service: Orthopedics;  Laterality: Right;  right Lapidus 1st tarsal metatarsal arthrodesis, right 2nd hammer toe correction, mcbride bunionectomy, 2nd metatarsal weil osteotomy  . TONSILLECTOMY    . TUBAL LIGATION      Social History:  reports that  has never smoked. she has never used smokeless tobacco. She reports that she drinks about 3.0 oz of alcohol per week. She reports that she does not use drugs.  Allergies:  No Known Allergies  Medications: No current facility-administered medications for this encounter.    Current Outpatient Medications  Medication Sig Dispense  Refill  . acetaminophen (TYLENOL) 500 MG tablet Take 1,000 mg by mouth every 8 (eight) hours as needed for headache.    . cetirizine (ZYRTEC) 10 MG tablet Take 10 mg by mouth daily as needed for allergies.    Marland Kitchen diclofenac (VOLTAREN) 75 MG EC tablet Take 75 mg by mouth 2 (two) times daily.    . diclofenac sodium (VOLTAREN) 1 % GEL Place 2 g onto the skin 4 (four) times daily as needed (pain).     . fluticasone (FLONASE) 50 MCG/ACT nasal spray Place 2 sprays into both nostrils daily as needed for allergies or rhinitis.    . hydrochlorothiazide (MICROZIDE) 12.5 MG capsule Take 12.5 mg by mouth daily.    Marland Kitchen lisinopril (PRINIVIL,ZESTRIL) 5 MG tablet Take 5 mg by mouth daily.    . rizatriptan (MAXALT) 10 MG tablet Take 10 mg by mouth as needed for migraine. May repeat in 2 hours if needed    . traMADol (ULTRAM) 50 MG tablet Take 50 mg by mouth daily as needed for moderate pain.    . celecoxib (CELEBREX) 100 MG capsule Take 100 mg by mouth 1 day or 1 dose.      No results found for this or any previous visit (from the past 48 hour(s)). No results found.  ROS: Pain with rom of the right upper extremity  Physical Exam:  Alert and oriented 67 y.o. female in no acute distress Cranial nerves 2-12 intact Cervical spine:  full rom with no tenderness, nv intact distally Chest: active breath sounds bilaterally, no wheeze rhonchi or rales Heart: regular rate and rhythm, no murmur Abd: non tender non distended with active bowel sounds Hip is stable with rom  Right shoulder with ER and IR strength 4/5 as compared to left nv intact distally No rashes or edema Mild crepitus with rom  Assessment/Plan Assessment: right shoulder end stage osteoarthritis  Plan: Patient will undergo a right total shoulder [facility]. Risks benefits and expectations were discussed with the patient. Patient understand risks, benefits and expectations and wishes to proceed.  Alphonsa OverallBrad Aitana Burry PA-C,  MPAS [facility]  [place] [address], [place], [place] [address] Phone: [phone] [email or web address] Facebook  [employer]  LinkedIn  Twitter

## 2017-05-27 ENCOUNTER — Encounter (HOSPITAL_COMMUNITY)
Admission: RE | Disposition: A | Discharge status: Home / Self Care | Payer: Self-pay | Source: Ambulatory Visit | Attending: Orthopedic Surgery

## 2017-05-27 ENCOUNTER — Encounter (HOSPITAL_COMMUNITY): Payer: Self-pay | Admitting: *Deleted

## 2017-05-27 ENCOUNTER — Inpatient Hospital Stay (HOSPITAL_COMMUNITY): Payer: Medicare Other | Admitting: Certified Registered Nurse Anesthetist

## 2017-05-27 ENCOUNTER — Inpatient Hospital Stay (HOSPITAL_COMMUNITY)
Admission: RE | Admit: 2017-05-27 | Discharge: 2017-05-28 | DRG: 483 | Disposition: A | Discharge status: Home / Self Care | Payer: Medicare Other | Source: Ambulatory Visit | Attending: Orthopedic Surgery | Admitting: Orthopedic Surgery

## 2017-05-27 ENCOUNTER — Other Ambulatory Visit: Payer: Self-pay

## 2017-05-27 ENCOUNTER — Inpatient Hospital Stay (HOSPITAL_COMMUNITY): Payer: Medicare Other

## 2017-05-27 DIAGNOSIS — R42 Dizziness and giddiness: Secondary | ICD-10-CM | POA: Diagnosis present

## 2017-05-27 DIAGNOSIS — J302 Other seasonal allergic rhinitis: Secondary | ICD-10-CM | POA: Diagnosis present

## 2017-05-27 DIAGNOSIS — I10 Essential (primary) hypertension: Secondary | ICD-10-CM | POA: Diagnosis present

## 2017-05-27 DIAGNOSIS — M19011 Primary osteoarthritis, right shoulder: Principal | ICD-10-CM | POA: Diagnosis present

## 2017-05-27 DIAGNOSIS — Z96611 Presence of right artificial shoulder joint: Secondary | ICD-10-CM

## 2017-05-27 DIAGNOSIS — E876 Hypokalemia: Secondary | ICD-10-CM | POA: Diagnosis present

## 2017-05-27 HISTORY — PX: TOTAL SHOULDER ARTHROPLASTY: SHX126

## 2017-05-27 SURGERY — ARTHROPLASTY, SHOULDER, TOTAL
Anesthesia: General | Site: Shoulder | Laterality: Right

## 2017-05-27 MED ORDER — MORPHINE SULFATE (PF) 4 MG/ML IV SOLN
2.0000 mg | INTRAVENOUS | Status: DC | PRN
  Administered 2017-05-28 (×2): 2 mg via INTRAVENOUS
  Filled 2017-05-27 (×2): qty 1

## 2017-05-27 MED ORDER — CEFAZOLIN SODIUM-DEXTROSE 2-4 GM/100ML-% IV SOLN
2.0000 g | INTRAVENOUS | Status: AC
  Administered 2017-05-27: 2 g via INTRAVENOUS
  Filled 2017-05-27: qty 100

## 2017-05-27 MED ORDER — DICLOFENAC SODIUM 75 MG PO TBEC
75.0000 mg | DELAYED_RELEASE_TABLET | Freq: Two times a day (BID) | ORAL | Status: DC
  Administered 2017-05-27 – 2017-05-28 (×3): 75 mg via ORAL
  Filled 2017-05-27 (×3): qty 1

## 2017-05-27 MED ORDER — ACETAMINOPHEN 650 MG RE SUPP: 650.0000 mg | RECTAL | Status: DC | PRN

## 2017-05-27 MED ORDER — DOCUSATE SODIUM 100 MG PO CAPS
100.0000 mg | ORAL_CAPSULE | Freq: Two times a day (BID) | ORAL | Status: DC
  Administered 2017-05-27 – 2017-05-28 (×3): 100 mg via ORAL
  Filled 2017-05-27 (×3): qty 1

## 2017-05-27 MED ORDER — PHENOL 1.4 % MT LIQD: 1.0000 | OROMUCOSAL | Status: DC | PRN

## 2017-05-27 MED ORDER — ROCURONIUM BROMIDE 10 MG/ML (PF) SYRINGE
PREFILLED_SYRINGE | INTRAVENOUS | Status: DC | PRN
  Administered 2017-05-27: 30 mg via INTRAVENOUS

## 2017-05-27 MED ORDER — ONDANSETRON HCL 4 MG/2ML IJ SOLN
INTRAMUSCULAR | Status: AC
  Filled 2017-05-27: qty 2

## 2017-05-27 MED ORDER — SUMATRIPTAN SUCCINATE 50 MG PO TABS: 50.0000 mg | ORAL_TABLET | Freq: Once | ORAL | Status: DC

## 2017-05-27 MED ORDER — METHOCARBAMOL 500 MG PO TABS: 500.0000 mg | ORAL_TABLET | Freq: Four times a day (QID) | ORAL | Status: DC | PRN

## 2017-05-27 MED ORDER — DEXAMETHASONE SODIUM PHOSPHATE 10 MG/ML IJ SOLN
INTRAMUSCULAR | Status: DC | PRN
  Administered 2017-05-27: 10 mg via INTRAVENOUS

## 2017-05-27 MED ORDER — CEFAZOLIN SODIUM-DEXTROSE 2-4 GM/100ML-% IV SOLN
2.0000 g | Freq: Four times a day (QID) | INTRAVENOUS | Status: AC
  Administered 2017-05-27 – 2017-05-28 (×3): 2 g via INTRAVENOUS
  Filled 2017-05-27 (×3): qty 100

## 2017-05-27 MED ORDER — SUGAMMADEX SODIUM 200 MG/2ML IV SOLN
INTRAVENOUS | Status: DC | PRN
  Administered 2017-05-27: 110.6 mg via INTRAVENOUS

## 2017-05-27 MED ORDER — ONDANSETRON HCL 4 MG/2ML IJ SOLN: 4.0000 mg | Freq: Four times a day (QID) | INTRAMUSCULAR | Status: DC | PRN

## 2017-05-27 MED ORDER — SODIUM CHLORIDE 0.9 % IV SOLN: INTRAVENOUS | Status: DC

## 2017-05-27 MED ORDER — THROMBIN 5000 UNITS EX SOLR
CUTANEOUS | Status: AC
  Filled 2017-05-27: qty 5000

## 2017-05-27 MED ORDER — SUGAMMADEX SODIUM 200 MG/2ML IV SOLN
INTRAVENOUS | Status: AC
  Filled 2017-05-27: qty 2

## 2017-05-27 MED ORDER — BUPIVACAINE HCL (PF) 0.25 % IJ SOLN
INTRAMUSCULAR | Status: DC | PRN
  Administered 2017-05-27: 10 mL

## 2017-05-27 MED ORDER — EPINEPHRINE PF 1 MG/ML IJ SOLN
INTRAMUSCULAR | Status: AC
  Filled 2017-05-27: qty 1

## 2017-05-27 MED ORDER — PROPOFOL 10 MG/ML IV BOLUS
INTRAVENOUS | Status: DC | PRN
  Administered 2017-05-27: 20 mg via INTRAVENOUS
  Administered 2017-05-27: 80 mg via INTRAVENOUS

## 2017-05-27 MED ORDER — FENTANYL CITRATE (PF) 100 MCG/2ML IJ SOLN
INTRAMUSCULAR | Status: DC | PRN
  Administered 2017-05-27 (×2): 50 ug via INTRAVENOUS

## 2017-05-27 MED ORDER — BUPIVACAINE HCL (PF) 0.25 % IJ SOLN
INTRAMUSCULAR | Status: AC
  Filled 2017-05-27: qty 30

## 2017-05-27 MED ORDER — DICLOFENAC SODIUM 1 % TD GEL
2.0000 g | Freq: Four times a day (QID) | TRANSDERMAL | Status: DC | PRN
  Filled 2017-05-27: qty 100

## 2017-05-27 MED ORDER — LIDOCAINE 2% (20 MG/ML) 5 ML SYRINGE
INTRAMUSCULAR | Status: DC | PRN
  Administered 2017-05-27: 20 mg via INTRAVENOUS

## 2017-05-27 MED ORDER — PHENYLEPHRINE HCL 10 MG/ML IJ SOLN
INTRAMUSCULAR | Status: DC | PRN
  Administered 2017-05-27: 25 ug/min via INTRAVENOUS

## 2017-05-27 MED ORDER — ARTIFICIAL TEARS OPHTHALMIC OINT
TOPICAL_OINTMENT | OPHTHALMIC | Status: AC
  Filled 2017-05-27: qty 3.5

## 2017-05-27 MED ORDER — BUPIVACAINE-EPINEPHRINE (PF) 0.5% -1:200000 IJ SOLN
INTRAMUSCULAR | Status: DC | PRN
  Administered 2017-05-27: 20 mL via PERINEURAL

## 2017-05-27 MED ORDER — FENTANYL CITRATE (PF) 250 MCG/5ML IJ SOLN
INTRAMUSCULAR | Status: AC
  Filled 2017-05-27: qty 5

## 2017-05-27 MED ORDER — HYDROCODONE-ACETAMINOPHEN 5-325 MG PO TABS
1.0000 | ORAL_TABLET | ORAL | Status: DC | PRN
  Administered 2017-05-27 – 2017-05-28 (×3): 1 via ORAL
  Filled 2017-05-27 (×3): qty 1

## 2017-05-27 MED ORDER — FLUTICASONE PROPIONATE 50 MCG/ACT NA SUSP: 2.0000 | Freq: Every day | NASAL | Status: DC | PRN

## 2017-05-27 MED ORDER — POLYETHYLENE GLYCOL 3350 17 G PO PACK: 17.0000 g | PACK | Freq: Every day | ORAL | Status: DC | PRN

## 2017-05-27 MED ORDER — MIDAZOLAM HCL 5 MG/5ML IJ SOLN
INTRAMUSCULAR | Status: DC | PRN
  Administered 2017-05-27 (×2): 1 mg via INTRAVENOUS

## 2017-05-27 MED ORDER — ROCURONIUM BROMIDE 10 MG/ML (PF) SYRINGE
PREFILLED_SYRINGE | INTRAVENOUS | Status: AC
  Filled 2017-05-27: qty 5

## 2017-05-27 MED ORDER — CHLORHEXIDINE GLUCONATE 4 % EX LIQD: 60.0000 mL | Freq: Once | CUTANEOUS | Status: DC

## 2017-05-27 MED ORDER — ONDANSETRON HCL 4 MG PO TABS: 4.0000 mg | ORAL_TABLET | Freq: Four times a day (QID) | ORAL | Status: DC | PRN

## 2017-05-27 MED ORDER — PROPOFOL 10 MG/ML IV BOLUS
INTRAVENOUS | Status: AC
  Filled 2017-05-27: qty 40

## 2017-05-27 MED ORDER — EPINEPHRINE PF 1 MG/ML IJ SOLN
INTRAMUSCULAR | Status: DC | PRN
  Administered 2017-05-27: .15 mL

## 2017-05-27 MED ORDER — METHOCARBAMOL 500 MG PO TABS: 500.0000 mg | ORAL_TABLET | Freq: Three times a day (TID) | ORAL | 1 refills | Status: DC | PRN

## 2017-05-27 MED ORDER — EPHEDRINE SULFATE-NACL 50-0.9 MG/10ML-% IV SOSY
PREFILLED_SYRINGE | INTRAVENOUS | Status: DC | PRN
  Administered 2017-05-27 (×4): 5 mg via INTRAVENOUS

## 2017-05-27 MED ORDER — METOCLOPRAMIDE HCL 5 MG PO TABS: 5.0000 mg | ORAL_TABLET | Freq: Three times a day (TID) | ORAL | Status: DC | PRN

## 2017-05-27 MED ORDER — ONDANSETRON HCL 4 MG/2ML IJ SOLN
INTRAMUSCULAR | Status: DC | PRN
  Administered 2017-05-27: 4 mg via INTRAVENOUS

## 2017-05-27 MED ORDER — MENTHOL 3 MG MT LOZG: 1.0000 | LOZENGE | OROMUCOSAL | Status: DC | PRN

## 2017-05-27 MED ORDER — ACETAMINOPHEN 500 MG PO TABS: 1000.0000 mg | ORAL_TABLET | Freq: Three times a day (TID) | ORAL | Status: DC | PRN

## 2017-05-27 MED ORDER — HYDROCHLOROTHIAZIDE 12.5 MG PO CAPS
12.5000 mg | ORAL_CAPSULE | Freq: Every day | ORAL | Status: DC
  Administered 2017-05-27: 12.5 mg via ORAL
  Filled 2017-05-27: qty 1

## 2017-05-27 MED ORDER — LISINOPRIL 10 MG PO TABS
5.0000 mg | ORAL_TABLET | Freq: Every day | ORAL | Status: DC
  Administered 2017-05-27: 5 mg via ORAL
  Filled 2017-05-27: qty 1

## 2017-05-27 MED ORDER — HYDROCODONE-ACETAMINOPHEN 5-325 MG PO TABS: 1.0000 | ORAL_TABLET | Freq: Four times a day (QID) | ORAL | 0 refills | Status: DC | PRN

## 2017-05-27 MED ORDER — MIDAZOLAM HCL 2 MG/2ML IJ SOLN
INTRAMUSCULAR | Status: AC
  Filled 2017-05-27: qty 2

## 2017-05-27 MED ORDER — METHOCARBAMOL 1000 MG/10ML IJ SOLN
500.0000 mg | Freq: Four times a day (QID) | INTRAVENOUS | Status: DC | PRN
  Administered 2017-05-28 (×2): 500 mg via INTRAVENOUS
  Filled 2017-05-27 (×2): qty 5

## 2017-05-27 MED ORDER — LACTATED RINGERS IV SOLN
INTRAVENOUS | Status: DC | PRN
  Administered 2017-05-27 (×2): via INTRAVENOUS

## 2017-05-27 MED ORDER — LIDOCAINE 2% (20 MG/ML) 5 ML SYRINGE
INTRAMUSCULAR | Status: AC
  Filled 2017-05-27: qty 5

## 2017-05-27 MED ORDER — DEXAMETHASONE SODIUM PHOSPHATE 10 MG/ML IJ SOLN
INTRAMUSCULAR | Status: AC
  Filled 2017-05-27: qty 1

## 2017-05-27 MED ORDER — ACETAMINOPHEN 325 MG PO TABS
650.0000 mg | ORAL_TABLET | ORAL | Status: DC | PRN
  Administered 2017-05-28: 650 mg via ORAL
  Filled 2017-05-27: qty 2

## 2017-05-27 MED ORDER — EPHEDRINE 5 MG/ML INJ
INTRAVENOUS | Status: AC
  Filled 2017-05-27: qty 10

## 2017-05-27 MED ORDER — HYDROMORPHONE HCL 1 MG/ML IJ SOLN: 0.2500 mg | INTRAMUSCULAR | Status: DC | PRN

## 2017-05-27 MED ORDER — 0.9 % SODIUM CHLORIDE (POUR BTL) OPTIME
TOPICAL | Status: DC | PRN
  Administered 2017-05-27: 1000 mL

## 2017-05-27 MED ORDER — TRAMADOL HCL 50 MG PO TABS: 50.0000 mg | ORAL_TABLET | Freq: Every day | ORAL | Status: DC | PRN

## 2017-05-27 MED ORDER — THROMBIN (RECOMBINANT) 5000 UNITS EX SOLR
CUTANEOUS | Status: DC | PRN
  Administered 2017-05-27: 5000 [IU] via TOPICAL

## 2017-05-27 MED ORDER — METOCLOPRAMIDE HCL 5 MG/ML IJ SOLN: 5.0000 mg | Freq: Three times a day (TID) | INTRAMUSCULAR | Status: DC | PRN

## 2017-05-27 MED ORDER — LORATADINE 10 MG PO TABS: 10.0000 mg | ORAL_TABLET | Freq: Every day | ORAL | Status: DC

## 2017-05-27 SURGICAL SUPPLY — 73 items
BIT DRILL 5/64X5 DISP (BIT) ×3 IMPLANT
BLADE SAW SAG 73X25 THK (BLADE) ×2
BLADE SAW SGTL 73X25 THK (BLADE) ×1 IMPLANT
BUR SURG 4X8 MED (BURR) ×1 IMPLANT
BURR SURG 4MMX8MM MEDIUM (BURR) ×1
BURR SURG 4X8 MED (BURR) ×2
CAPT SHLDR PARTIAL 2 ×3 IMPLANT
CEMENT HV SMART SET (Cement) ×3 IMPLANT
CLOSURE WOUND 1/2 X4 (GAUZE/BANDAGES/DRESSINGS) ×1
COVER SURGICAL LIGHT HANDLE (MISCELLANEOUS) ×3 IMPLANT
DRAPE IMP U-DRAPE 54X76 (DRAPES) ×3 IMPLANT
DRAPE INCISE IOBAN 66X45 STRL (DRAPES) ×6 IMPLANT
DRAPE ORTHO SPLIT 77X108 STRL (DRAPES) ×4
DRAPE SURG ORHT 6 SPLT 77X108 (DRAPES) ×2 IMPLANT
DRAPE U-SHAPE 47X51 STRL (DRAPES) ×3 IMPLANT
DRSG ADAPTIC 3X8 NADH LF (GAUZE/BANDAGES/DRESSINGS) ×3 IMPLANT
DRSG PAD ABDOMINAL 8X10 ST (GAUZE/BANDAGES/DRESSINGS) ×6 IMPLANT
DURAPREP 26ML APPLICATOR (WOUND CARE) ×3 IMPLANT
ELECT BLADE 4.0 EZ CLEAN MEGAD (MISCELLANEOUS) ×3
ELECT NEEDLE TIP 2.8 STRL (NEEDLE) ×3 IMPLANT
ELECT REM PT RETURN 9FT ADLT (ELECTROSURGICAL) ×3
ELECTRODE BLDE 4.0 EZ CLN MEGD (MISCELLANEOUS) ×1 IMPLANT
ELECTRODE REM PT RTRN 9FT ADLT (ELECTROSURGICAL) ×1 IMPLANT
GAUZE SPONGE 4X4 12PLY STRL (GAUZE/BANDAGES/DRESSINGS) ×3 IMPLANT
GAUZE SPONGE 4X4 12PLY STRL LF (GAUZE/BANDAGES/DRESSINGS) ×3 IMPLANT
GLENOID GLOBL STEP APG 40X3 RT (Joint) ×1 IMPLANT
GLOBAL GLEN STEP APG 40X3 RT (Joint) ×3 IMPLANT
GLOVE BIOGEL PI ORTHO PRO 7.5 (GLOVE) ×2
GLOVE BIOGEL PI ORTHO PRO SZ8 (GLOVE) ×2
GLOVE ORTHO TXT STRL SZ7.5 (GLOVE) ×3 IMPLANT
GLOVE PI ORTHO PRO STRL 7.5 (GLOVE) ×1 IMPLANT
GLOVE PI ORTHO PRO STRL SZ8 (GLOVE) ×1 IMPLANT
GLOVE SURG ORTHO 8.5 STRL (GLOVE) ×6 IMPLANT
GOWN STRL REUS W/ TWL XL LVL3 (GOWN DISPOSABLE) ×3 IMPLANT
GOWN STRL REUS W/TWL XL LVL3 (GOWN DISPOSABLE) ×6
GUIDE PIN 2.5 (PIN) ×3 IMPLANT
KIT BASIN OR (CUSTOM PROCEDURE TRAY) ×3 IMPLANT
KIT ROOM TURNOVER OR (KITS) ×3 IMPLANT
MANIFOLD NEPTUNE II (INSTRUMENTS) ×3 IMPLANT
NDL SUT 6 .5 CRC .975X.05 MAYO (NEEDLE) ×1 IMPLANT
NEEDLE 1/2 CIR MAYO (NEEDLE) ×3 IMPLANT
NEEDLE HYPO 25GX1X1/2 BEV (NEEDLE) ×3 IMPLANT
NEEDLE MAYO TAPER (NEEDLE) ×2
NS IRRIG 1000ML POUR BTL (IV SOLUTION) ×3 IMPLANT
PACK SHOULDER (CUSTOM PROCEDURE TRAY) ×3 IMPLANT
PAD ABD 8X10 STRL (GAUZE/BANDAGES/DRESSINGS) ×3 IMPLANT
PAD ARMBOARD 7.5X6 YLW CONV (MISCELLANEOUS) ×6 IMPLANT
RASP SURGI OSCILL SIZE +3 SM (MISCELLANEOUS) ×3 IMPLANT
RASP SURGI SIZE +5 SM F/APG (MISCELLANEOUS) ×3 IMPLANT
SLING ARM FOAM STRAP MED (SOFTGOODS) ×3 IMPLANT
SMARTMIX MINI TOWER (MISCELLANEOUS) ×3
SPONGE LAP 18X18 X RAY DECT (DISPOSABLE) ×3 IMPLANT
SPONGE LAP 4X18 X RAY DECT (DISPOSABLE) ×3 IMPLANT
SPONGE SURGIFOAM ABS GEL 12-7 (HEMOSTASIS) ×3 IMPLANT
SPONGE SURGIFOAM ABS GEL SZ50 (HEMOSTASIS) IMPLANT
STRIP CLOSURE SKIN 1/2X4 (GAUZE/BANDAGES/DRESSINGS) ×2 IMPLANT
SUCTION FRAZIER HANDLE 10FR (MISCELLANEOUS) ×2
SUCTION TUBE FRAZIER 10FR DISP (MISCELLANEOUS) ×1 IMPLANT
SUT FIBERWIRE #2 38 T-5 BLUE (SUTURE) ×6
SUT MNCRL AB 4-0 PS2 18 (SUTURE) ×3 IMPLANT
SUT VIC AB 0 CT1 27 (SUTURE) ×2
SUT VIC AB 0 CT1 27XBRD ANBCTR (SUTURE) ×1 IMPLANT
SUT VIC AB 0 CT2 27 (SUTURE) ×3 IMPLANT
SUT VIC AB 2-0 CT1 27 (SUTURE) ×2
SUT VIC AB 2-0 CT1 TAPERPNT 27 (SUTURE) ×1 IMPLANT
SUT VICRYL AB 2 0 TIES (SUTURE) ×3 IMPLANT
SUTURE FIBERWR #2 38 T-5 BLUE (SUTURE) ×2 IMPLANT
SYR CONTROL 10ML LL (SYRINGE) ×3 IMPLANT
TOWEL OR 17X24 6PK STRL BLUE (TOWEL DISPOSABLE) ×3 IMPLANT
TOWEL OR 17X26 10 PK STRL BLUE (TOWEL DISPOSABLE) ×3 IMPLANT
TOWER SMARTMIX MINI (MISCELLANEOUS) ×1 IMPLANT
TRAY FOLEY W/METER SILVER 16FR (SET/KITS/TRAYS/PACK) IMPLANT
YANKAUER SUCT BULB TIP NO VENT (SUCTIONS) IMPLANT

## 2017-05-27 NOTE — Discharge Instructions (Signed)
Ice to the shoulder as much as possible.  Keep the incision clean and dry and covered with a sterile bandage for one week, then ok to get it wet in the shower.  Do not PUSH, PULL, or LIFT with the right arm.  Use sling while up and pillows while seated to protect the arm.  Follow up in the office in two weeks, call [phone]

## 2017-05-27 NOTE — Transfer of Care (Signed)
Immediate Anesthesia Transfer of Care Note  Patient: Summer Cooper  Procedure(s) Performed: RIGHT TOTAL SHOULDER ARTHROPLASTY (Right Shoulder)  Patient Location: PACU  Anesthesia Type:GA combined with regional for post-op pain  Level of Consciousness: awake, alert , oriented and patient cooperative  Airway & Oxygen Therapy: Patient Spontanous Breathing and Patient connected to nasal cannula oxygen  Post-op Assessment: Report given to RN and Post -op Vital signs reviewed and stable  Post vital signs: Reviewed and stable  Last Vitals:  Vitals:   05/27/17 0600 05/27/17 1043  BP: (!) 151/74   Pulse: 86   Resp: 20   Temp: 36.5 C (P) 36.6 C  SpO2: 100%     Last Pain:  Vitals:   05/27/17 1043  TempSrc:   PainSc: (P) 0-No pain         Complications: No apparent anesthesia complications

## 2017-05-27 NOTE — Anesthesia Procedure Notes (Signed)
Procedure Name: Intubation Date/Time: 05/27/2017 7:51 AM Performed by: Waynard EdwardsSmith, Illianna Paschal A, CRNA Pre-anesthesia Checklist: Patient identified, Emergency Drugs available, Suction available and Patient being monitored Patient Re-evaluated:Patient Re-evaluated prior to induction Oxygen Delivery Method: Circle system utilized Preoxygenation: Pre-oxygenation with 100% oxygen Induction Type: IV induction Ventilation: Mask ventilation without difficulty Laryngoscope Size: Glidescope and 4 Grade View: Grade I Tube type: Oral Tube size: 7.0 mm Number of attempts: 2 Airway Equipment and Method: Stylet and Video-laryngoscopy Placement Confirmation: ETT inserted through vocal cords under direct vision,  positive ETCO2 and breath sounds checked- equal and bilateral Secured at: 21 cm Tube secured with: Tape Dental Injury: Teeth and Oropharynx as per pre-operative assessment  Comments: DL x1 with Mil 2.  Grade 3 view.  Unable to see beyond epiglottis.  Decision made to proceed to glidescope.  Positive BMV and VSS.  DL x2 with glidescope 4.  Grade 1 view.  EBBS and VSS

## 2017-05-27 NOTE — Anesthesia Postprocedure Evaluation (Signed)
Anesthesia Post Note  Patient: Benjaman PottCarolyn A O Brien  Procedure(s) Performed: RIGHT TOTAL SHOULDER ARTHROPLASTY (Right Shoulder)     Patient location during evaluation: PACU Anesthesia Type: General and Regional Level of consciousness: awake and alert Pain management: pain level controlled Vital Signs Assessment: post-procedure vital signs reviewed and stable Respiratory status: spontaneous breathing, nonlabored ventilation and respiratory function stable Cardiovascular status: blood pressure returned to baseline and stable Postop Assessment: no apparent nausea or vomiting Anesthetic complications: no    Last Vitals:  Vitals:   05/27/17 1145 05/27/17 1213  BP:  135/74  Pulse:  (!) 110  Resp:  18  Temp: 36.7 C (!) 36.4 C  SpO2:  96%    Last Pain:  Vitals:   05/27/17 1145  TempSrc:   PainSc: 0-No pain                 Hetty Linhart,W. EDMOND

## 2017-05-27 NOTE — Interval H&P Note (Signed)
History and Physical Interval Note:  05/27/2017 7:27 AM  Summer Cooper  has presented today for surgery, with the diagnosis of right shoulder osteoarthritis  The various methods of treatment have been discussed with the patient and family. After consideration of risks, benefits and other options for treatment, the patient has consented to  Procedure(s): RIGHT TOTAL SHOULDER ARTHROPLASTY (Right) as a surgical intervention .  The patient's history has been reviewed, patient examined, no change in status, stable for surgery.  I have reviewed the patient's chart and labs.  Questions were answered to the patient's satisfaction.     Franky Reier,STEVEN R

## 2017-05-27 NOTE — Anesthesia Procedure Notes (Signed)
Anesthesia Regional Block: Interscalene brachial plexus block   Pre-Anesthetic Checklist: ,, timeout performed, Correct Patient, Correct Site, Correct Laterality, Correct Procedure, Correct Position, site marked, Risks and benefits discussed, pre-op evaluation,  At surgeon's request and post-op pain management  Laterality: Right  Prep: Maximum Sterile Barrier Precautions used, chloraprep       Needles:  Injection technique: Single-shot  Needle Type: Echogenic Stimulator Needle     Needle Length: 5cm  Needle Gauge: 22     Additional Needles:   Procedures:,,,, ultrasound used (permanent image in chart),,,,  Narrative:  Start time: 05/27/2017 6:50 AM End time: 05/27/2017 7:00 AM Injection made incrementally with aspirations every 5 mL. Anesthesiologist: Gaynelle AduFitzgerald, Nia Nathaniel, MD  Additional Notes: 2% Lidocaine skin wheel.

## 2017-05-27 NOTE — Brief Op Note (Signed)
05/27/2017  10:48 AM  PATIENT:  Summer Cooper  67 y.o. female  PRE-OPERATIVE DIAGNOSIS:  right shoulder osteoarthritis, end stage  POST-OPERATIVE DIAGNOSIS:  right shoulder osteoarthritis, end stage  PROCEDURE:  Procedure(s): RIGHT TOTAL SHOULDER ARTHROPLASTY (Right) [employer] Unite with [employer] glenoid 40mm +3   SURGEON:  Surgeon(s) and Role:    Beverely Low* Ilona Colley, MD - Primary  PHYSICIAN ASSISTANT:   ASSISTANTS: Thea Gisthomas B Dixon, PA-C   ANESTHESIA:   regional and general  EBL:  150 mL   BLOOD ADMINISTERED:none  DRAINS: none   LOCAL MEDICATIONS USED:  MARCAINE     SPECIMEN:  No Specimen  DISPOSITION OF SPECIMEN:  N/A  COUNTS:  YES  TOURNIQUET:  * No tourniquets in log *  DICTATION: .Other Dictation: Dictation Number [id number]  PLAN OF CARE: Admit to inpatient   PATIENT DISPOSITION:  PACU - hemodynamically stable.   Delay start of Pharmacological VTE agent (>24hrs) due to surgical blood loss or risk of bleeding: not applicable

## 2017-05-28 LAB — BASIC METABOLIC PANEL
Anion gap: 11 (ref 5–15)
BUN: 24 mg/dL — AB (ref 6–20)
CHLORIDE: 99 mmol/L — AB (ref 101–111)
CO2: 25 mmol/L (ref 22–32)
Calcium: 8.3 mg/dL — ABNORMAL LOW (ref 8.9–10.3)
Creatinine, Ser: 1.1 mg/dL — ABNORMAL HIGH (ref 0.44–1.00)
GFR calc Af Amer: 58 mL/min — ABNORMAL LOW (ref >60)
GFR calc non Af Amer: 50 mL/min — ABNORMAL LOW (ref >60)
Glucose, Bld: 116 mg/dL — ABNORMAL HIGH (ref 65–99)
POTASSIUM: 2.9 mmol/L — AB (ref 3.5–5.1)
SODIUM: 135 mmol/L (ref 135–145)

## 2017-05-28 LAB — HEMOGLOBIN AND HEMATOCRIT, BLOOD
HCT: 28.9 % — ABNORMAL LOW (ref 36.0–46.0)
Hemoglobin: 9.8 g/dL — ABNORMAL LOW (ref 12.0–15.0)

## 2017-05-28 MED ORDER — SODIUM CHLORIDE 0.9 % IV BOLUS (SEPSIS)
500.0000 mL | Freq: Once | INTRAVENOUS | Status: AC
  Administered 2017-05-28: 500 mL via INTRAVENOUS

## 2017-05-28 MED ORDER — POTASSIUM CHLORIDE CRYS ER 20 MEQ PO TBCR
20.0000 meq | EXTENDED_RELEASE_TABLET | Freq: Once | ORAL | Status: AC
  Administered 2017-05-28: 20 meq via ORAL
  Filled 2017-05-28: qty 1

## 2017-05-28 NOTE — Progress Notes (Signed)
Orthopedics Progress Note  Subjective: Patient complaining of dizziness when sitting up this morning and increased pain since the block wore off.  Objective:  Vitals:   05/28/17 0325 05/28/17 0648  BP: (!) 93/52 (!) 81/47  Pulse: 75   Resp: 16   Temp: 98.4 F (36.9 C)   SpO2: 99%     General: Awake and alert  Musculoskeletal: right shoulder wound looks good, no active bleeding and no drainage, dressing changed  Neurovascularly intact  Lab Results  Component Value Date   WBC 6.3 05/23/2017   HGB 9.8 (L) 05/28/2017   HCT 28.9 (L) 05/28/2017   MCV 98.3 05/23/2017   PLT 306 05/23/2017       Component Value Date/Time   NA 135 05/28/2017 0404   K 2.9 (L) 05/28/2017 0404   CL 99 (L) 05/28/2017 0404   CO2 25 05/28/2017 0404   GLUCOSE 116 (H) 05/28/2017 0404   BUN 24 (H) 05/28/2017 0404   CREATININE 1.10 (H) 05/28/2017 0404   CALCIUM 8.3 (L) 05/28/2017 0404   GFRNONAA 50 (L) 05/28/2017 0404   GFRAA 58 (L) 05/28/2017 0404    No results found for: INR, PROTIME  Assessment/Plan: POD #1 s/p Procedure(s): RIGHT TOTAL SHOULDER ARTHROPLASTY Orthostatic this morning. Will give her a 500 cc NS Bolus.  Hypokalemic at 2.9.  Will give Kdur 20 MEq x 1 this morning as well. Up slowly with nursing. OT consult pending prior to discharge.    Almedia BallsSteven R. Ranell PatrickNorris, MD 05/28/2017 7:34 AM

## 2017-05-28 NOTE — Discharge Summary (Addendum)
Orthopedic Discharge Summary        Physician Discharge Summary  Patient ID: Summer Cooper MRN: [id number] DOB/AGE: 10-19-1950 67 y.o.  Admit date: 05/27/2017 Discharge date:  05/27/2017  Procedures:  Procedure(s) (LRB): RIGHT TOTAL SHOULDER ARTHROPLASTY (Right)  Attending Physician:  Dr. Malon Kindle  Admission Diagnoses:   Right shoulder end staged arthritis  Discharge Diagnoses: Right shoulder end staged arthritis  Past Medical History:  Diagnosis Date  . Arthritis   . 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 Cataracts, bilateral    mild, no surgery yet  . Complication of anesthesia    hard to wake up (DURING TUBAL LIGATION.  OTHER SURGERIES WERE FINE)  . Headache    last flare up 04/06/2017  . Hearing loss    mild case - no aids used  . Hypertension   . Seasonal allergies     HPI:    Patient with severe shoulder pain and dysfunction due to end staged OA right shoulder  PCP: Merri Brunette, MD   Discharged Condition: good  Hospital Course:  Patient underwent the above stated procedure on 05/27/2017. Patient tolerated the procedure well and brought to the recovery room in good condition and subsequently to the floor. Patient had a post-op course marked by some orthostasis and intolerance of pain medication - dizziness. Fluid bolus given and pain under good control prior to D/C   Discharge Exam: shoulder incision clean and dry and intact, NVI  Disposition: 01-Home or Self Care with follow up in 2 weeks   Follow-up Information    Beverely Low, MD. Call in 2 weeks.   Specialty:  Orthopedic Surgery Why:  [phone] Contact information: [address] [place] [address] [phone]           Discharge Instructions    Call MD / Call 911   Complete by:  As directed    If you experience chest pain or shortness of breath, CALL 911 and be transported to the hospital emergency room.  If you develope a fever above 101 F, pus (white  drainage) or increased drainage or redness at the wound, or calf pain, call your surgeon's office.   Constipation Prevention   Complete by:  As directed    Drink plenty of fluids.  Prune juice may be helpful.  You may use a stool softener, such as Colace (over the counter) 100 mg twice a day.  Use MiraLax (over the counter) for constipation as needed.   Diet - low sodium heart healthy   Complete by:  As directed    Increase activity slowly as tolerated   Complete by:  As directed       Allergies as of 05/28/2017   No Known Allergies     Medication List    STOP taking these medications   celecoxib 100 MG capsule Commonly known as:  CELEBREX     TAKE these medications   acetaminophen 500 MG tablet Commonly known as:  TYLENOL Take 1,000 mg by mouth every 8 (eight) hours as needed for headache.   cetirizine 10 MG tablet Commonly known as:  ZYRTEC Take 10 mg by mouth daily as needed for allergies.   diclofenac 75 MG EC tablet Commonly known as:  VOLTAREN Take 75 mg by mouth 2 (two) times daily.   fluticasone 50 MCG/ACT nasal spray Commonly known as:  FLONASE Place 2 sprays into both nostrils daily as needed for allergies or rhinitis.  hydrochlorothiazide 12.5 MG capsule Commonly known as:  MICROZIDE Take 12.5 mg by mouth daily.   HYDROcodone-acetaminophen 5-325 MG tablet Commonly known as:  NORCO Take 1 tablet by mouth every 6 (six) hours as needed for moderate pain.   lisinopril 5 MG tablet Commonly known as:  PRINIVIL,ZESTRIL Take 5 mg by mouth daily.   methocarbamol 500 MG tablet Commonly known as:  ROBAXIN Take 1 tablet (500 mg total) by mouth 3 (three) times daily as needed.   rizatriptan 10 MG tablet Commonly known as:  MAXALT Take 10 mg by mouth as needed for migraine. May repeat in 2 hours if needed   traMADol 50 MG tablet Commonly known as:  ULTRAM Take 50 mg by mouth daily as needed for moderate pain.   VOLTAREN 1 % Gel Generic drug:  diclofenac  sodium Place 2 g onto the skin 4 (four) times daily as needed (pain).         Signed: Verlee RossettiORRIS,STEVEN R 05/28/2017, 7:40 AM [facility]  [place] [address], [place], [place] [address] Phone: [phone] Facebook  Instagram  [employer]  Twitter

## 2017-05-28 NOTE — Evaluation (Signed)
Occupational Therapy Evaluation Patient Details Name: Summer Cooper MRN: [id number] DOB: [birth year] Today's Date: 05/28/2017    History of Present Illness Pt is a 67 y.o. female s/p R total shoulder arthroplasty. She has a PMH significant for arthritis, carpal tunnel syndrome, cataracts (bilateral, complication of anesthesia, headache, hearing loss, hypertension, seasonal allergies.    Clinical Impression   PTA, pt was independent with ADL and functional mobility and working as a Engineer, civil (consulting). On OT evaluation, pt with some lightheadedness but willing and eager to participate. Pt educated concerning conservative shoulder protocol including R UE NWB status, sling wear schedule, and no AROM/PROM R shoulder as well as HEP including lap slides, gentle pendulums, and AROM R elbow/wrist/hand. Handouts provided accordingly to reinforce education. Pt currently requires min assist for UB bathing and LB ADL and mod assist for UB dressing tasks. Her husband demonstrates the ability to provide all necessary assistance and was present and engaged in the duration of today's session. Note plan to return home today and feel pt is safe for D/C home with husband from OT stand-point. OT will continue to follow while admitted.    Follow Up Recommendations  DC plan and follow up therapy as arranged by surgeon;Supervision/Assistance - 24 hour    Equipment Recommendations  None recommended by OT    Recommendations for Other Services       Precautions / Restrictions Precautions Precautions: Shoulder Type of Shoulder Precautions: Conservative protocol: NWB R UE, No reaching with R UE, Sling at all times except dressing/bathing/exercise, AROM R elbow/wrist/hand, no AROM/PROM R shoulder, OK for gentle lap slides/pendulums Shoulder Interventions: Shoulder sling/immobilizer;Off for dressing/bathing/exercises Precaution Booklet Issued: Yes (comment) Precaution Comments: Reviewed conservative shoulder protocol in detail.   Required Braces or Orthoses: Sling(R shoulder) Restrictions Weight Bearing Restrictions: Yes RUE Weight Bearing: Non weight bearing      Mobility Bed Mobility Overal bed mobility: Needs Assistance Bed Mobility: Supine to Sit     Supine to sit: Supervision;HOB elevated     General bed mobility comments: Supervision for safety.   Transfers Overall transfer level: Needs assistance Equipment used: None Transfers: Sit to/from Stand Sit to Stand: Min guard         General transfer comment: Increased time and min guard to maintain safety as pt slightly lightheaded.     Balance Overall balance assessment: No apparent balance deficits (not formally assessed)                                         ADL either performed or assessed with clinical judgement   ADL Overall ADL's : Needs assistance/impaired Eating/Feeding: Set up;Sitting   Grooming: Minimal assistance;Standing   Upper Body Bathing: Minimal assistance;With caregiver independent assisting;Sitting   Lower Body Bathing: Minimal assistance;With caregiver independent assisting;Sit to/from stand   Upper Body Dressing : With caregiver independent assisting;Sitting;Moderate assistance   Lower Body Dressing: Minimal assistance;With caregiver independent assisting;Sit to/from stand   Toilet Transfer: Min guard;Ambulation Toilet Transfer Details (indicate cue type and reason): Simulated in room Toileting- Clothing Manipulation and Hygiene: Minimal assistance;Sit to/from stand       Functional mobility during ADLs: Min guard General ADL Comments: Pt remains slightly lightheaded throughout activity but able to complete dressing and HEP with overall min-mod assist. Husband present and demonstrates understanding of methods to assist.      Vision Baseline Vision/History: Cataracts Patient Visual Report: No change from baseline  Vision Assessment?: No apparent visual deficits Additional Comments:  Functional for tasks assessed.      Perception     Praxis      Pertinent Vitals/Pain Pain Assessment: 0-10 Pain Score: 2  Pain Location: R shoulder Pain Descriptors / Indicators: Aching;Operative site guarding;Sore Pain Intervention(s): Limited activity within patient's tolerance;Monitored during session;Repositioned     Hand Dominance Right   Extremity/Trunk Assessment Upper Extremity Assessment Upper Extremity Assessment: RUE deficits/detail RUE Deficits / Details: Sensation in tact. Post-operative pain and decreased ROM within parameters of protocol.    Lower Extremity Assessment Lower Extremity Assessment: Overall WFL for tasks assessed       Communication Communication Communication: No difficulties   Cognition Arousal/Alertness: Awake/alert Behavior During Therapy: WFL for tasks assessed/performed Overall Cognitive Status: Within Functional Limits for tasks assessed                                     General Comments  Husband present and engaged in session.     Exercises Exercises: Shoulder Shoulder Exercises Pendulum Exercise: Right;10 reps;Seated;Standing(both seated and standing for 10 reps) Elbow Flexion: AROM;Right;10 reps;Standing Elbow Extension: AROM;Right;10 reps;Standing Wrist Flexion: AROM;Right;10 reps;Seated Wrist Extension: AROM;Right;10 reps;Seated Digit Composite Flexion: AROM;Right;10 reps;Seated Composite Extension: AROM;Right;10 reps;Seated   Shoulder Instructions Shoulder Instructions Donning/doffing shirt without moving shoulder: Moderate assistance;Caregiver independent with task Method for sponge bathing under operated UE: Minimal assistance;Caregiver independent with task Donning/doffing sling/immobilizer: Moderate assistance;Caregiver independent with task Correct positioning of sling/immobilizer: Minimal assistance;Caregiver independent with task Pendulum exercises (written home exercise program): Min-guard;Caregiver  independent with task ROM for elbow, wrist and digits of operated UE: Min-guard;Caregiver independent with task Sling wearing schedule (on at all times/off for ADL's): Supervision/safety;Caregiver independent with task Proper positioning of operated UE when showering: Supervision/safety;Caregiver independent with task Positioning of UE while sleeping: Supervision/safety;Caregiver independent with task    Home Living Family/patient expects to be discharged to:: Private residence Living Arrangements: Spouse/significant other Available Help at Discharge: Family;Available 24 hours/day Type of Home: House Home Access: Stairs to enter [employer] of Steps: 1+1 Entrance Stairs-Rails: None Home Layout: Two level;Able to live on main level with bedroom/bathroom Alternate Level Stairs-Number of Steps: flight   Bathroom Shower/Tub: Walk-in shower         Home Equipment: Grab bars - tub/shower          Prior Functioning/Environment Level of Independence: Independent        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 Problem List: Decreased strength;Decreased activity tolerance;Impaired balance (sitting and/or standing);Decreased safety awareness;Decreased knowledge of use of DME or AE;Decreased knowledge of precautions;Decreased range of motion;Pain;Impaired UE functional use      OT Treatment/Interventions: Self-care/ADL training;Therapeutic exercise;Energy conservation;DME and/or AE instruction;Therapeutic activities;Patient/family education;Balance training    OT Goals(Current goals can be found in the care plan section) Acute Rehab OT Goals Patient Stated Goal: feel stronger OT Goal Formulation: With patient/family Time For Goal Achievement: 06/11/17 Potential to Achieve Goals: Good  OT Frequency: Min 2X/week   Barriers to D/C:            Co-evaluation              AM-PAC PT "6 Clicks" Daily Activity     Outcome Measure Help from another person eating  meals?: A Little Help from another person taking care of personal grooming?: A Little Help from another person toileting, which includes using  toliet, bedpan, or urinal?: A Little Help from another person bathing (including washing, rinsing, drying)?: A Little Help from another person to put on and taking off regular upper body clothing?: A Lot Help from another person to put on and taking off regular lower body clothing?: A Little 6 Click Score: 17   End of Session Equipment Utilized During Treatment: Other (comment)(R shoulder sling) Nurse Communication: Mobility status  Activity Tolerance: Patient tolerated treatment well Patient left: in chair;with call bell/phone within reach;with family/visitor present  OT Visit Diagnosis: Other abnormalities of gait and mobility (R26.89);Pain Pain - Right/Left: Right Pain - part of body: Shoulder                Time: [id number] OT Time Calculation (min): 41 min Charges:  OT General Charges $OT Visit: 1 Visit OT Evaluation $OT Eval Moderate Complexity: 1 Mod OT Treatments $Self Care/Home Management : 8-22 mins $Therapeutic Exercise: 8-22 mins G-Codes:     Doristine Sectionharity A Malasha Kleppe, MS OTR/L  Pager: [phone]   Lennan Malone A Lelar Farewell 05/28/2017, 10:10 AM

## 2017-05-28 NOTE — Progress Notes (Signed)
Patient is discharged from room 3C06 at this time. Alert and in stable condition. IV site d/c'[d and instructions read to patient and spouse with understanding verbalized. Left unit via wheelchair with all belongings at side. 

## 2017-05-30 MED FILL — Thrombin For Soln 5000 Unit: CUTANEOUS | Qty: 5000 | Status: AC

## 2017-05-30 NOTE — Op Note (Signed)
Summer Cooper, HEEG NO.:  [id number]  MEDICAL RECORD NO.:  [id number]  LOCATION:                                 FACILITY:  PHYSICIAN:  Almedia Balls. Ranell Patrick, M.D.      DATE OF BIRTH:  DATE OF PROCEDURE:  05/27/2017 DATE OF DISCHARGE:  05/28/2017                              OPERATIVE REPORT   PREOPERATIVE DIAGNOSIS:  Right shoulder end-stage osteoarthritis.  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  PROCEDURE PERFORMED:  Right anatomic total shoulder arthroplasty using DePuy [employer] Unite System with Steptech 40 mm +3 glenoid.  ATTENDING SURGEON:  Almedia Balls. Ranell Patrick, M.D.  ASSISTANT:  Konrad Felix Dixon, [place], who was scrubbed during the entire procedure.  ANESTHESIA:  General anesthesia was used plus interscalene block.  ESTIMATED BLOOD LOSS:  150 mL.  FLUID REPLACEMENT:  1500 mL of crystalloid.  INSTRUMENT COUNTS:  Correct.  COMPLICATIONS:  There were no complications.  Perioperative antibiotics were given.  INDICATIONS:  The patient is a 67 year old female with worsening right shoulder pain and dysfunction secondary to severe end-stage arthritis. The patient has advanced bone loss posteriorly with significant posterior subluxation of her humeral head and a B2 glenoid deficiency. Having failed an extended period of conservative management, the patient presents for operative treatment to restore her joint line and to eliminate pain and restore function.  Risks and benefits of the surgical management were discussed in detail with the patient, informed consent obtained.  DESCRIPTION OF PROCEDURE:  After an adequate level of anesthesia was achieved, the patient was positioned in the modified beach-chair position.  Right shoulder was correctly identified and sterilely prepped and draped in the usual manner.  Time-out was called.  We went ahead and entered the shoulder using standard deltopectoral  approach, starting at the coracoid process, extending down to the anterior humerus. Dissection down through the subcutaneous tissues using Bovie. Identified the cephalic vein and took it laterally to the deltoid, pectoralis taken medially.  Conjoint tendon identified and retracted medially.  We tenodesed the biceps in situ, suturing into the pec tendon.  Next, we released the subscapularis subperiosteally off the lesser tuberosity and tagged for repair at the end with #2 FiberWire suture in a modified Mason-Allen suture technique.  We then went ahead and released the inferior capsule off the neck of the humerus, progressively externally rotating.  We took a Comptroller and went over the top of the humeral head right at the rotator cuff insertion site, placed the elbow at the patient's side and placed the elbow in approximately 15 to 20 degrees of external rotation.  We then placed a Crego elevator medially and used our neck resection guide to mark the head at the appropriate height and did our head resection with the oscillating saw.  We were able to get that cut right down at the rotator cuff insertion site.  We then removed the osteophytes off the inferior humerus, progressively externally rotating and getting back to native bone.  We then subluxed the humerus posteriorly to get good exposure for the glenoid, but we were able to do a capsular labral removal 360 degrees, released the biceps  and the superior labrum.  We gained 360 exposure noting extreme retroversion on the glenoid side.  We sized the glenoid to a size 40.  We then placed our central guide pin referencing off the scapular body, which we were able to palpate inferiorly and referencing off the anterior and posterior aspect of the glenoid.  Once we had the guide pin in, we reamed the high side for the 40 Steptech glenoid, which is an Production assistant, radioAnchor peg design.  We then placed our anterior rasping guide in the  appropriate position.  With this in, we drilled our central peg hole, placed that central rasping guide in the peg hole and then pinned it anteriorly.  We then took a rasp, which is a +3 rasp and rasped the bone posteriorly until we could get a good flat bed for that posterior portion of the glenoid to sit on.  Once we had that done to accommodate a +3 poly build up in the back, then we trialed and felt like we had good bony support.  We then drilled our superior, anterior inferior, and anterior posterior.  All the peg holes were fully contained in bone except the anterior inferior one which although it looked like it was well inside of the glenoid, it did perforate anteromedially.  Once we had that done, we still felt like we had good stable support.  We placed Gelfoam-soaked thrombin in 3 peripheral holes.  We then vacuum mixed DePuy high-viscosity cement and placed cement in the 3 peripheral holes and then impacted the Steptech 40+ 3 APG glenoid in position.  We held that until the cement was hardened for 17 minutes.  We had a nice stable implant that did not rock or move at all.  We then directed our attention towards the humeral side.  We reamed up to a size 10 for the canal diameter.  The patient's endosteal diameter was fairly narrow, so we had to work at getting the 10, but we were able to get it, and then we broached with the 8 and then the 10 trial at approximately 20 degrees retroversion.  We impacted that in position and then placed the 44 x 18 eccentric trial in place that gave good head coverage, and we reduced the shoulder, we had nice stable shoulder.  We were able to translate inferiorly 50%, posteriorly 50%. We removed the trial components, irrigated thoroughly, placed drill holes in the lesser tuberosity and then placed sutures for repair of the subscapularis, #2 FiberWire.  Once we had the sutures in place, then we used available bone graft from the head and used a  press-fit size 10 stem, and this was a [employer] Unite System with a 10 stem and a 10 metaphysis, and placed in 20 degrees of retroversion, impacted that in position.  It was seated nicely and very stable.  We used again available bone graft medially for an impaction grafting technique.  With that in place, we selected real 44 x 18 eccentric and then dialed that posterior superiorly for excellent head coverage.  We then reduced the shoulder, irrigated thoroughly and repaired the subscapularis anatomically back to the lesser tuberosity.  Then, deltopectoral closure with 0 Vicryl suture followed by 2-0 Vicryl for subcutaneous closure and 4-0 Monocryl for skin.  Steri-Strips applied followed by sterile dressing.  The patient tolerated the surgery well.     Almedia BallsSteven R. Ranell PatrickNorris, M.D.     SRN/MEDQ  D:  05/27/2017  T:  05/28/2017  Job:  [id number]

## 2017-05-31 ENCOUNTER — Encounter (HOSPITAL_COMMUNITY): Payer: Self-pay | Admitting: Orthopedic Surgery

## 2018-11-07 ENCOUNTER — Encounter: Payer: Self-pay | Admitting: Cardiology

## 2018-11-07 DIAGNOSIS — I10 Essential (primary) hypertension: Secondary | ICD-10-CM

## 2018-11-07 HISTORY — DX: Essential (primary) hypertension: I10

## 2018-11-09 ENCOUNTER — Encounter: Payer: Self-pay | Admitting: Cardiology

## 2018-11-09 ENCOUNTER — Other Ambulatory Visit: Payer: Self-pay

## 2018-11-09 ENCOUNTER — Ambulatory Visit (INDEPENDENT_AMBULATORY_CARE_PROVIDER_SITE_OTHER): Payer: Medicare Other | Admitting: Cardiology

## 2018-11-09 VITALS — BP 119/72 | HR 82 | Ht 60.0 in | Wt 121.7 lb

## 2018-11-09 DIAGNOSIS — I358 Other nonrheumatic aortic valve disorders: Secondary | ICD-10-CM | POA: Diagnosis not present

## 2018-11-09 DIAGNOSIS — Z01810 Encounter for preprocedural cardiovascular examination: Secondary | ICD-10-CM | POA: Diagnosis not present

## 2018-11-09 DIAGNOSIS — R9431 Abnormal electrocardiogram [ECG] [EKG]: Secondary | ICD-10-CM | POA: Diagnosis not present

## 2018-11-09 DIAGNOSIS — I10 Essential (primary) hypertension: Secondary | ICD-10-CM

## 2018-11-09 NOTE — Progress Notes (Signed)
Primary Physician:  Carol Ada, MD   Patient ID: Summer Cooper, female    DOB: 05-19-1950, 68 y.o.   MRN: [id number]  Subjective:    Chief Complaint  Patient presents with  . Abnormal ECG  . New Patient (Initial Visit)    HPI: Summer Cooper  is a 68 y.o. female  With hypertension, history of migraine headaches, osteoarthritis, referred to [place] for perioperative cardiac risk stratification for upcoming left shoulder arthroplasty with Dr. Veverly Fells and for abnormal EKG noted in PCP office that had changed compared to previous.  Migraines are fairly stable. She had surgery last year on her right shoulder, that she tolerated well. Hypertension is well controlled. Reports that her lipids are borderline, but not on therapy as her HDL is good. No history of diabetes or thyroid disorders.  She does not exercise, but is fairly active. She does climb stairs at home that she tolerates well. Also does yard work and states that she is always busy doing something. No chest pain or shortness of breath. Has chronic leg swelling when on her feet at the end of the day.   No former tobacco use. She drinks 5 glasses of wine a week. No illicit drug use. 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   Past Medical History:  Diagnosis Date  . Arthritis   . 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 Cataracts, bilateral    mild, no surgery yet  . Complication of anesthesia    hard to wake up (DURING TUBAL LIGATION.  OTHER SURGERIES WERE FINE)  . Headache    last flare up 04/06/2017  . Hearing loss    mild case - no aids used  . HTN (hypertension) 11/07/2018  . Hypertension   . Seasonal allergies     Past Surgical History:  Procedure Laterality Date  . COLONOSCOPY    . I&D EXTREMITY     cat bite-rt wrist  . TARSAL METATARSAL ARTHRODESIS  05/13/2011   Procedure: TARSAL METATARSAL FUSION;  Surgeon: Wylene Simmer, MD;  Location: [place];  Service: Orthopedics;  Laterality: Right;  right Lapidus 1st tarsal metatarsal arthrodesis, right 2nd hammer toe correction, mcbride bunionectomy, 2nd metatarsal weil osteotomy  . TONSILLECTOMY    . TOTAL SHOULDER ARTHROPLASTY Right 05/27/2017   Procedure: RIGHT TOTAL SHOULDER ARTHROPLASTY;  Surgeon: Netta Cedars, MD;  Location: [place];  Service: Orthopedics;  Laterality: Right;  . TUBAL LIGATION      Social History   Socioeconomic History  . Marital status: Married    Spouse name: Not on file  . Number of children: 1  . Years of education: Not on file  . Highest education level: Not on file  Occupational History  . Not on file  Social Needs  . Financial resource strain: Not on file  . Food insecurity    Worry: Not on file    Inability: Not on file  . Transportation needs    Medical: Not on file    Non-medical: Not on file  Tobacco Use  . Smoking status: Never Smoker  . Smokeless tobacco: Never Used  Substance and Sexual Activity  . Alcohol use: Yes    Alcohol/week: 5.0 standard drinks    Types: 5 Glasses of wine per week  . Drug use: No  . Sexual activity: Not on file  Lifestyle  . Physical activity    Days per  week: Not on file    Minutes per session: Not on file  . Stress: Not on file  Relationships  . Social Herbalist on phone: Not on file    Gets together: Not on file    Attends religious service: Not on file    Active member of club or organization: Not on file    Attends meetings of clubs or organizations: Not on file    Relationship status: Not on file  . Intimate partner violence    Fear of current or ex partner: Not on file    Emotionally abused: Not on file    Physically abused: Not on file    Forced sexual activity: Not on file  Other Topics Concern  . Not on file  Social History Narrative  . Not on file    Review of Systems  Constitution: Negative for decreased appetite, malaise/fatigue, weight gain and weight loss.  Eyes:  Negative for visual disturbance.  Cardiovascular: Negative for chest pain, claudication, dyspnea on exertion, leg swelling, orthopnea, palpitations and syncope.  Respiratory: Negative for hemoptysis and wheezing.   Endocrine: Negative for cold intolerance and heat intolerance.  Hematologic/Lymphatic: Does not bruise/bleed easily.  Skin: Negative for nail changes.  Musculoskeletal: Negative for muscle weakness and myalgias.  Gastrointestinal: Negative for abdominal pain, change in bowel habit, nausea and vomiting.  Neurological: Negative for difficulty with concentration, dizziness, focal weakness and headaches.  Psychiatric/Behavioral: Negative for altered mental status and suicidal ideas.  All other systems reviewed and are negative.     Objective:  Blood pressure 119/72, pulse 82, height 5' (1.524 m), weight 121 lb 11.2 oz (55.2 kg), SpO2 96 %. Body mass index is 23.77 kg/m.    Physical Exam  Constitutional: She is oriented to person, place, and time. Vital signs are normal. She appears well-developed and well-nourished.  HENT:  Head: Normocephalic and atraumatic.  Neck: Normal range of motion.  Cardiovascular: Normal rate, regular rhythm and intact distal pulses.  Murmur heard. High-pitched blowing decrescendo early diastolic murmur is present with a grade of 2/6 at the upper right sternal border radiating to the apex. Pulmonary/Chest: Effort normal and breath sounds normal. No accessory muscle usage. No respiratory distress.  Abdominal: Soft. Bowel sounds are normal.  Musculoskeletal: Normal range of motion.  Neurological: She is alert and oriented to person, place, and time.  Skin: Skin is warm and dry.  Vitals reviewed.  Radiology: No results found.  Laboratory examination:   PCP labs 05/29/2018: RBC 3.92, normal H&H, CBC otherwise normal.  Creatinine 0.92, EGFR 60/73, potassium 4.1, CMP normal.  TSH 2.68.  Cholesterol 253, triglycerides 134, HDL 105, LDL 121.  CMP Latest  Ref Rng & Units 05/28/2017 05/23/2017 05/12/2011  Glucose 65 - 99 mg/dL 116(H) 91 142(H)  BUN 6 - 20 mg/dL 24(H) 20 23  Creatinine 0.44 - 1.00 mg/dL 1.10(H) 0.87 0.78  Sodium 135 - 145 mmol/L 135 136 134(L)  Potassium 3.5 - 5.1 mmol/L 2.9(L) 3.7 4.0  Chloride 101 - 111 mmol/L 99(L) 98(L) 97  CO2 22 - 32 mmol/L _0 Calcium 8.9 - 10.3 mg/dL 8.3(L) 9.4 9.8   CBC Latest Ref Rng & Units 05/28/2017 05/23/2017 05/13/2011  WBC 4.0 - 10.5 K/uL - 6.3 -  Hemoglobin 12.0 - 15.0 g/dL 9.8(L) 12.8 12.4  Hematocrit 36.0 - 46.0 % 28.9(L) 39.8 -  Platelets 150 - 400 K/uL - 306 -   Lipid Panel  No results found for: CHOL, TRIG, HDL,  CHOLHDL, VLDL, LDLCALC, LDLDIRECT HEMOGLOBIN A1C No results found for: HGBA1C, MPG TSH No results for input(s): TSH in the last 8760 hours.  PRN Meds:. Medications Discontinued During This Encounter  Medication Reason  . HYDROcodone-acetaminophen (NORCO) 5-325 MG tablet Error  . methocarbamol (ROBAXIN) 500 MG tablet Error  . rizatriptan (MAXALT) 10 MG tablet Error   Current Meds  Medication Sig  . acetaminophen (TYLENOL) 500 MG tablet Take 1,000 mg by mouth every 8 (eight) hours as needed for headache.  . cetirizine (ZYRTEC) 10 MG tablet Take 10 mg by mouth daily as needed for allergies.  Marland Kitchen diclofenac (VOLTAREN) 75 MG EC tablet Take 75 mg by mouth 2 (two) times daily.  . diclofenac sodium (VOLTAREN) 1 % GEL Place 2 g onto the skin 4 (four) times daily as needed (pain).   . fluticasone (FLONASE) 50 MCG/ACT nasal spray Place 2 sprays into both nostrils daily as needed for allergies or rhinitis.  . hydrochlorothiazide (MICROZIDE) 12.5 MG capsule Take 12.5 mg by mouth daily.  Marland Kitchen lisinopril (PRINIVIL,ZESTRIL) 5 MG tablet Take 5 mg by mouth daily.  . Multiple Vitamin (MULTIVITAMIN) tablet Take by mouth.  . SUMAtriptan (IMITREX) 50 MG tablet sumatriptan succinate  50 mg tabs  . traMADol (ULTRAM) 50 MG tablet Take 50 mg by mouth daily as needed for moderate pain.    Cardiac  Studies:     Assessment:     ICD-10-CM   1. Preoperative cardiovascular examination  Z01.810 EKG 12-Lead  2. Abnormal EKG  R94.31 EKG 12-Lead  3. Essential hypertension  I10 EKG 12-Lead   EKG 11/09/2018: Normal sinus rhythm at 69 bpm, normal axis, PRWP, cannot exclude anterior infarct old. No evidence of ischemia. Previously noted T wave inversion in V3 on PCP EKG, not noted.   PCP EKG 11/07/2018: Normal sinus rhythm at 83 bpm, normal axis, poor R wave progression cannot exclude anterior infarct old.  T wave inversion in V3, cannot exclude anterior ischemia.  Abnormal EKG.  Recommendations:   Patient referred to [place] on urgent basis for evaluation of abnormal EKG and preoperative cardiac risk stratification for upcoming left shoulder arthroplasty.  She is without symptoms of chest pain or shortness of breath. She has good functional capacity of at least 7 METS as she is able to climb stairs and work in her yard without any chest pain or shortness of breath. Hypertension is well controlled. She has also went through shoulder surgery one year ago without perioperative complications. I have reviewed EKG from PCP office as well as previous EKG. She did have T wave inversion in anterior leads particularly V3, but is not noted today. Nonspecific EKG abnormalities could have potentially been related to hypertension. I have a low suspicion for CAD. As her surgery is low risk and she has good functional capacity, feel that she may proceed with surgery low risk for perioperative complications. Will send clearance letter to Dr. Veverly Fells.  She does have AI murmur on exam today. I will obtain echocardiogram for evaluation, but does not have to be performed prior to surgery. I would like to see her back after this for follow up to discuss the results.    *I have discussed this case with Dr. Einar Gip and he personally examined the patient and participated in formulating the plan.*   Miquel Dunn, MSN,  APRN, FNP-C [facility]. [place] Office: [phone] Fax: [phone]

## 2018-12-12 ENCOUNTER — Ambulatory Visit (INDEPENDENT_AMBULATORY_CARE_PROVIDER_SITE_OTHER): Payer: Medicare Other

## 2018-12-12 ENCOUNTER — Other Ambulatory Visit: Payer: Self-pay

## 2018-12-12 DIAGNOSIS — Z01810 Encounter for preprocedural cardiovascular examination: Secondary | ICD-10-CM

## 2018-12-12 DIAGNOSIS — I358 Other nonrheumatic aortic valve disorders: Secondary | ICD-10-CM

## 2018-12-14 NOTE — H&P (Signed)
Patient's anticipated LOS is less than 2 midnights, meeting these requirements: - Younger than 7065 - Lives within 1 hour of care - Has a competent adult at home to recover with post-op recover - NO history of  - Chronic pain requiring opiods  - Diabetes  - Coronary Artery Disease  - Heart failure  - Heart attack  - Stroke  - DVT/VTE  - Cardiac arrhythmia  - Respiratory Failure/COPD  - Renal failure  - Anemia  - Advanced Liver disease       Summer Cooper is an 68 y.o. female.    Chief Complaint: left shoulder pain  HPI: Pt is a 68 y.o. female complaining of left shoulder pain for multiple years. Pain had continually increased since the beginning. X-rays in the clinic show end-stage arthritic changes of the left shoulder. Pt has tried various conservative treatments which have failed to alleviate their symptoms, including injections and therapy. Various options are discussed with the patient. Risks, benefits and expectations were discussed with the patient. Patient understand the risks, benefits and expectations and wishes to proceed with surgery.   PCP:  Merri BrunetteSmith, Candace, MD  D/C Plans: Home  PMH: Past Medical History:  Diagnosis Date  . Arthritis   . 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 Cataracts, bilateral    mild, no surgery yet  . Complication of anesthesia    hard to wake up (DURING TUBAL LIGATION.  OTHER SURGERIES WERE FINE)  . Headache    last flare up 04/06/2017  . Hearing loss    mild case - no aids used  . HTN (hypertension) 11/07/2018  . Hypertension   . Seasonal allergies     PSH: Past Surgical History:  Procedure Laterality Date  . COLONOSCOPY    . I&D EXTREMITY     cat bite-rt wrist  . TARSAL METATARSAL ARTHRODESIS  05/13/2011   Procedure: TARSAL METATARSAL FUSION;  Surgeon: Toni ArthursJohn Hewitt, MD;  [facility];  Service: Orthopedics;  Laterality: Right;  right Lapidus 1st tarsal metatarsal arthrodesis, right 2nd hammer  toe correction, mcbride bunionectomy, 2nd metatarsal weil osteotomy  . TONSILLECTOMY    . TOTAL SHOULDER ARTHROPLASTY Right 05/27/2017   Procedure: RIGHT TOTAL SHOULDER ARTHROPLASTY;  Surgeon: Beverely LowNorris, Steve, MD;  [facility];  Service: Orthopedics;  Laterality: Right;  . TUBAL LIGATION      Social History:  reports that she has never smoked. She has never used smokeless tobacco. She reports current alcohol use of about 5.0 standard drinks of alcohol per week. She reports that she does not use drugs.  Allergies:  No Known Allergies  Medications: No current facility-administered medications for this encounter.    Current Outpatient Medications  Medication Sig Dispense Refill  . acetaminophen (TYLENOL) 500 MG tablet Take 1,000 mg by mouth every 8 (eight) hours as needed for headache.    . Calcium Carbonate-Vitamin D (CALCIUM-VITAMIN D) 600-125 MG-UNIT TABS Take 1 tablet by mouth daily.    . cetirizine (ZYRTEC) 10 MG tablet Take 10 mg by mouth daily as needed for allergies.    Marland Kitchen. diclofenac (VOLTAREN) 75 MG EC tablet Take 75 mg by mouth 2 (two) times daily.    . diclofenac sodium (VOLTAREN) 1 % GEL Place 2 g onto the skin 4 (four) times daily as needed (pain).     . fluticasone (FLONASE) 50 MCG/ACT nasal spray Place 2 sprays into both nostrils daily as needed for allergies or rhinitis.    .Marland Kitchen  hydrochlorothiazide (MICROZIDE) 12.5 MG capsule Take 12.5 mg by mouth daily.    Marland Kitchen lisinopril (PRINIVIL,ZESTRIL) 5 MG tablet Take 5 mg by mouth daily.    . SUMAtriptan (IMITREX) 50 MG tablet Take 50 mg by mouth every 2 (two) hours as needed for migraine.     . traMADol (ULTRAM) 50 MG tablet Take 50 mg by mouth daily as needed for moderate pain.      No results found for this or any previous visit (from the past 48 hour(s)). No results found.  ROS: Pain with rom of the left upper extremity  Physical Exam: Alert and oriented 68 y.o. female in no acute distress Cranial nerves 2-12 intact Cervical  spine: full rom with no tenderness, nv intact distally Chest: active breath sounds bilaterally, no wheeze rhonchi or rales Heart: regular rate and rhythm, no murmur Abd: non tender non distended with active bowel sounds Hip is stable with rom  Left shoulder with limited rom and weakness nv intact distally Crepitus with rom  Assessment/Plan Assessment: left shoulder cuff arthropathy  Plan:  Patient will undergo a left reverse total shoulder by Dr. Veverly Fells at [facility]. Risks benefits and expectations were discussed with the patient. Patient understand risks, benefits and expectations and wishes to proceed. Preoperative templating of the joint replacement has been completed, documented, and submitted to the Operating Room personnel in order to optimize intra-operative equipment management.   Merla Riches PA-C, MPAS [facility] is now [employer] [address], [place], [place], [place] [address] Phone: [phone] [email or web address] Facebook  [employer]

## 2018-12-15 ENCOUNTER — Encounter (HOSPITAL_COMMUNITY): Payer: Self-pay

## 2018-12-15 NOTE — Patient Instructions (Signed)
DUE TO COVID-19 ONLY ONE VISITOR IS ALLOWED TO COME WITH YOU AND STAY IN THE WAITING ROOM ONLY DURING PRE OP AND PROCEDURE. THE ONE VISITOR MAY VISIT WITH YOU IN YOUR PRIVATE ROOM DURING VISITING HOURS ONLY!!   COVID SWAB TESTING COMPLETED ON:  Tuesday, Aug. 25, 2020.  (Must self quarantine after testing. Follow instructions on handout.)             Your procedure is scheduled on: Friday, Aug. 28, 2020   [facility] Main  Entr[facility]   Report to Short Stay at 5:30 AM   Call this number if you have problems the morning of surgery [phone]   Do not eat food :After Midnight.   May have liquids until 4:30AM day of surgery   CLEAR LIQUID DIET  Foods Allowed                                                                     Foods Excluded  Water, Black Coffee and tea, regular and decaf                             liquids that you cannot  Plain Jell-O in any flavor  (No red)                                           see through such as: Fruit ices (not with fruit pulp)                                     milk, soups, orange juice  Iced Popsicles (No red)                                    All solid food Carbonated beverages, regular and diet                                    Apple juices Sports drinks like Gatorade (No red) Lightly seasoned clear broth or consume(fat free) Sugar, honey syrup  Sample Menu Breakfast                                Lunch                                     Supper Cranberry juice                    Beef broth                            Chicken broth Jell-O  Grape juice                           Apple juice Coffee or tea                        Jell-O                                      Popsicle                                                Coffee or tea                        Coffee or tea   Complete one Ensure drink the morning of surgery at 4:30AM the day of surgery.   Brush your teeth the  morning of surgery.   Do NOT smoke after Midnight   Take these medicines the morning of surgery with A SIP OF WATER: Cetirizne if needed   May use Flonase if needed                               You may not have any metal on your body including hair pins, jewelry, and body piercings             Do not wear make-up, lotions, powders, perfumes/cologne, or deodorant             Do not wear nail polish.  Do not shave  48 hours prior to surgery.             Do not bring valuables to the hospital. [place] IS NOT             RESPONSIBLE   FOR VALUABLES.   Contacts, dentures or bridgework may not be worn into surgery.   Bring small overnight bag day of surgery.    Special Instructions: Bring a copy of your healthcare power of attorney and living will documents         the day of surgery if you haven't scanned them in before.              Please read over the following fact sheets you were given:  [facility] - Preparing [facility] Before surgery, you can play an important role.  Because skin is not sterile, your skin needs to be as free of germs as possible.  You can reduce the number of germs on your skin by washing with CHG (chlorahexidine gluconate) soap before surgery.  CHG is an antiseptic cleaner which kills germs and bonds with the skin to continue killing germs even after washing. Please DO NOT use if you have an allergy to CHG or antibacterial soaps.  If your skin becomes reddened/irritated stop using the CHG and inform your nurse when you arrive at Short Stay. Do not shave (including legs and underarms) for at least 48 hours prior to the first CHG shower.  You may shave your face/neck.  Please follow these instructions carefully:  1.  Shower with CHG Soap the night before surgery and the  morning of surgery.  2.  If you choose to wash your hair, wash your hair first as usual with your normal  shampoo.  3.  After you shampoo, rinse your hair and body thoroughly to remove the  shampoo.                             4.  Use CHG as you would any other liquid soap.  You can apply chg directly to the skin and wash.  Gently with a scrungie or clean washcloth.  5.  Apply the CHG Soap to your body ONLY FROM THE NECK DOWN.   Do   not use on face/ open                           Wound or open sores. Avoid contact with eyes, ears mouth and   genitals (private parts).                       Wash face,  Genitals (private parts) with your normal soap.             6.  Wash thoroughly, paying special attention to the area where your    surgery  will be performed.  7.  Thoroughly rinse your body with warm water from the neck down.  8.  DO NOT shower/wash with your normal soap after using and rinsing off the CHG Soap.                9.  Pat yourself dry with a clean towel.            10.  Wear clean pajamas.            11.  Place clean sheets on your bed the night of your first shower and do not  sleep with pets. Day of Surgery : Do not apply any lotions/deodorants the morning of surgery.  Please wear clean clothes to the hospital/surgery center.  FAILURE TO FOLLOW THESE INSTRUCTIONS MAY RESULT IN THE CANCELLATION OF YOUR SURGERY  PATIENT SIGNATURE_________________________________  NURSE SIGNATURE__________________________________  ________________________________________________________________________   Adam Phenix  An incentive spirometer is a tool that can help keep your lungs clear and active. This tool measures how well you are filling your lungs with each breath. Taking long deep breaths may help reverse or decrease the chance of developing breathing (pulmonary) problems (especially infection) following:  A long period of time when you are unable to move or be active. BEFORE THE PROCEDURE   If the spirometer includes an indicator to show your best effort, your nurse or respiratory therapist will set it to a desired goal.  If possible, sit up straight or lean  slightly forward. Try not to slouch.  Hold the incentive spirometer in an upright position. INSTRUCTIONS FOR USE  1. Sit on the edge of your bed if possible, or sit up as far as you can in bed or on a chair. 2. Hold the incentive spirometer in an upright position. 3. Breathe out normally. 4. Place the mouthpiece in your mouth and seal your lips tightly around it. 5. Breathe in slowly and as deeply as possible, raising the piston or the ball toward the top of the column. 6. Hold your breath for 3-5 seconds or for as long as possible. Allow the piston or ball to fall to the bottom of the column. 7. Remove the mouthpiece from your mouth  and breathe out normally. 8. Rest for a few seconds and repeat Steps 1 through 7 at least 10 times every 1-2 hours when you are awake. Take your time and take a few normal breaths between deep breaths. 9. The spirometer may include an indicator to show your best effort. Use the indicator as a goal to work toward during each repetition. 10. After each set of 10 deep breaths, practice coughing to be sure your lungs are clear. If you have an incision (the cut made at the time of surgery), support your incision when coughing by placing a pillow or rolled up towels firmly against it. Once you are able to get out of bed, walk around indoors and cough well. You may stop using the incentive spirometer when instructed by your caregiver.  RISKS AND COMPLICATIONS  Take your time so you do not get dizzy or light-headed.  If you are in pain, you may need to take or ask for pain medication before doing incentive spirometry. It is harder to take a deep breath if you are having pain. AFTER USE  Rest and breathe slowly and easily.  It can be helpful to keep track of a log of your progress. Your caregiver can provide you with a simple table to help with this. If you are using the spirometer at home, follow these instructions: SEEK MEDICAL CARE IF:   You are having difficultly  using the spirometer.  You have trouble using the spirometer as often as instructed.  Your pain medication is not giving enough relief while using the spirometer.  You develop fever of 100.5 F (38.1 C) or higher. SEEK IMMEDIATE MEDICAL CARE IF:   You cough up bloody sputum that had not been present before.  You develop fever of 102 F (38.9 C) or greater.  You develop worsening pain at or near the incision site. MAKE SURE YOU:   Understand these instructions.  Will watch your condition.  Will get help right away if you are not doing well or get worse. Document Released: 08/23/2006 Document Revised: 07/05/2011 Document Reviewed: 10/24/2006 [facility] Patient Information 2014 [place] [place].   ________________________________________________________________________

## 2018-12-15 NOTE — Progress Notes (Signed)
The following are in epic: Surgical Clearance A. Georgina Peer 11/09/2018 EKG 11/09/2018 ECHO 12/12/2018

## 2018-12-19 ENCOUNTER — Other Ambulatory Visit: Payer: Self-pay

## 2018-12-19 ENCOUNTER — Encounter (HOSPITAL_COMMUNITY)
Admission: RE | Admit: 2018-12-19 | Discharge: 2018-12-19 | Disposition: A | Discharge status: Home / Self Care | Payer: Medicare Other | Source: Ambulatory Visit | Attending: Orthopedic Surgery | Admitting: Orthopedic Surgery

## 2018-12-19 ENCOUNTER — Other Ambulatory Visit (HOSPITAL_COMMUNITY)
Admission: RE | Admit: 2018-12-19 | Discharge: 2018-12-19 | Disposition: A | Discharge status: Home / Self Care | Payer: Medicare Other | Source: Ambulatory Visit | Attending: Orthopedic Surgery | Admitting: Orthopedic Surgery

## 2018-12-19 ENCOUNTER — Encounter (HOSPITAL_COMMUNITY): Payer: Self-pay

## 2018-12-19 DIAGNOSIS — Z20828 Contact with and (suspected) exposure to other viral communicable diseases: Secondary | ICD-10-CM | POA: Diagnosis not present

## 2018-12-19 DIAGNOSIS — Z01812 Encounter for preprocedural laboratory examination: Secondary | ICD-10-CM | POA: Diagnosis present

## 2018-12-19 DIAGNOSIS — M12812 Other specific arthropathies, not elsewhere classified, left shoulder: Secondary | ICD-10-CM | POA: Diagnosis not present

## 2018-12-19 LAB — CBC
HCT: 37.8 % (ref 36.0–46.0)
Hemoglobin: 12.5 g/dL (ref 12.0–15.0)
MCH: 32.2 pg (ref 26.0–34.0)
MCHC: 33.1 g/dL (ref 30.0–36.0)
MCV: 97.4 fL (ref 80.0–100.0)
Platelets: 303 10*3/uL (ref 150–400)
RBC: 3.88 MIL/uL (ref 3.87–5.11)
RDW: 13.5 % (ref 11.5–15.5)
WBC: 7 10*3/uL (ref 4.0–10.5)
nRBC: 0 % (ref 0.0–0.2)

## 2018-12-19 LAB — SURGICAL PCR SCREEN
MRSA, PCR: NEGATIVE
Staphylococcus aureus: NEGATIVE

## 2018-12-19 LAB — BASIC METABOLIC PANEL
Anion gap: 11 (ref 5–15)
BUN: 45 mg/dL — ABNORMAL HIGH (ref 8–23)
CO2: 25 mmol/L (ref 22–32)
Calcium: 9.6 mg/dL (ref 8.9–10.3)
Chloride: 100 mmol/L (ref 98–111)
Creatinine, Ser: 1.36 mg/dL — ABNORMAL HIGH (ref 0.44–1.00)
GFR calc Af Amer: 46 mL/min — ABNORMAL LOW (ref >60)
GFR calc non Af Amer: 39 mL/min — ABNORMAL LOW (ref >60)
Glucose, Bld: 111 mg/dL — ABNORMAL HIGH (ref 70–99)
Potassium: 4.5 mmol/L (ref 3.5–5.1)
Sodium: 136 mmol/L (ref 135–145)

## 2018-12-19 LAB — SARS CORONAVIRUS 2 (TAT 6-24 HRS): SARS Coronavirus 2: NEGATIVE

## 2018-12-19 MED ORDER — CHLORHEXIDINE GLUCONATE 4 % EX LIQD
60.0000 mL | Freq: Once | CUTANEOUS | Status: DC
  Filled 2018-12-19: qty 60

## 2018-12-19 NOTE — Progress Notes (Signed)
Chart to [employer]. To review labs elevated BUN 12/19/2018.

## 2018-12-19 NOTE — Progress Notes (Signed)
PCP - Carol Ada Cardiologist - Binnie Kand, NP  Chest x-ray - n/a EKG - 11/09/2018 Epic Stress Test - n/a ECHO - 12/12/2018 Epic Cardiac Cath - n/a  Sleep Study - n/a CPAP - n/a  Fasting Blood Sugar - n/a Checks Blood Sugar __n/a___ times a day  Blood Thinner Instructions:n/a Aspirin Instructions:n/a Last Dose:n/a  Anesthesia review: n/a  Patient denies shortness of breath, fever, cough and chest pain at PAT appointment   Patient verbalized understanding of instructions that were given to them at the PAT appointment. Patient was also instructed that they will need to review over the PAT instructions again at home before surgery.

## 2018-12-19 NOTE — Progress Notes (Signed)
SPOKE W/  _[name]     SCREENING SYMPTOMS OF COVID 19:   COUGH-- NO  RUNNY NOSE--- NO  SORE THROAT---NO  NASAL CONGESTION----NO  SNEEZING----NO  SHORTNESS OF BREATH---NO  DIFFICULTY BREATHING---NO  TEMP >100.0 -----NO  UNEXPLAINED BODY ACHES------NO  CHILLS -------- NO  HEADACHES ---------NO  LOSS OF SMELL/ TASTE --------NO    HAVE YOU OR ANY FAMILY MEMBER TRAVELLED PAST 14 DAYS OUT OF THE   COUNTY---YES STATE----NO COUNTRY----NO  HAVE YOU OR ANY FAMILY MEMBER BEEN EXPOSED TO ANYONE WITH COVID 19? NO

## 2018-12-21 ENCOUNTER — Encounter: Payer: Self-pay | Admitting: Cardiology

## 2018-12-21 ENCOUNTER — Other Ambulatory Visit: Payer: Self-pay

## 2018-12-21 ENCOUNTER — Telehealth (INDEPENDENT_AMBULATORY_CARE_PROVIDER_SITE_OTHER): Payer: Medicare Other | Admitting: Cardiology

## 2018-12-21 VITALS — BP 128/73 | HR 73 | Ht 60.0 in | Wt 120.0 lb

## 2018-12-21 DIAGNOSIS — I10 Essential (primary) hypertension: Secondary | ICD-10-CM | POA: Diagnosis not present

## 2018-12-21 DIAGNOSIS — I351 Nonrheumatic aortic (valve) insufficiency: Secondary | ICD-10-CM | POA: Diagnosis not present

## 2018-12-21 DIAGNOSIS — R9431 Abnormal electrocardiogram [ECG] [EKG]: Secondary | ICD-10-CM | POA: Diagnosis not present

## 2018-12-21 NOTE — Anesthesia Preprocedure Evaluation (Addendum)
Anesthesia Evaluation  Patient identified by MRN, date of birth, ID band Patient awake    Reviewed: Allergy & Precautions, NPO status , Patient's Chart, lab work & pertinent test results  History of Anesthesia Complications Negative for: history of anesthetic complications  Airway Mallampati: II  TM Distance: >3 FB Neck ROM: Full    Dental  (+) Dental Advisory Given   Pulmonary neg pulmonary ROS,    Pulmonary exam normal        Cardiovascular hypertension, Pt. on medications Normal cardiovascular exam+ Valvular Problems/Murmurs AI    '20 TTE - EF 67%. Indeterminate diastolic filling pattern.  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 Moderate AI. Mild tricuspid regurgitation. Estimated pulmonary artery systolic pressure is 25 mmHg.  Pt seen by cardiology 12/21/2018.  Moderate aortic regurgitation, asymptomatic, stable.  No further cardiac workup recommended prior to procedure.   Neuro/Psych  Headaches,  Hearing loss   Neuromuscular disease (b/l carpal tunnel syndrome) negative psych ROS   GI/Hepatic negative GI ROS, Neg liver ROS,   Endo/Other  negative endocrine ROS  Renal/GU Renal InsufficiencyRenal disease     Musculoskeletal  (+) Arthritis ,   Abdominal   Peds  Hematology negative hematology ROS (+)   Anesthesia Other Findings   Reproductive/Obstetrics                           Anesthesia Physical Anesthesia Plan  ASA: III  Anesthesia Plan: General   Post-op Pain Management:  Regional for Post-op pain   Induction: Intravenous  PONV Risk Score and Plan: 3 and Treatment may vary due to age or medical condition, Ondansetron and Dexamethasone  Airway Management Planned: Oral ETT  Additional Equipment: None  Intra-op Plan:   Post-operative Plan: Extubation in OR  Informed Consent: I have reviewed the patients History and  Physical, chart, labs and discussed the procedure including the risks, benefits and alternatives for the proposed anesthesia with the patient or authorized representative who has indicated his/her understanding and acceptance.     Dental advisory given  Plan Discussed with: CRNA and Anesthesiologist  Anesthesia Plan Comments:       Anesthesia Quick Evaluation

## 2018-12-21 NOTE — Progress Notes (Signed)
Primary Physician:  Carol Ada, MD   Patient ID: Summer Cooper, female    DOB: [birth year], 68 y.o.   MRN: [id number]  Subjective:    Chief Complaint  Patient presents with  . Hypertension  . Follow-up   This visit type was conducted due to national recommendations for restrictions regarding the COVID-19 Pandemic (e.g. social distancing).  This format is felt to be most appropriate for this patient at this time.  All issues noted in this document were discussed and addressed.  No physical exam was performed (except for noted visual exam findings with Telehealth visits).  The patient has consented to conduct a Telehealth visit and understands insurance will be billed.   I discussed the limitations of evaluation and management by telemedicine and the availability of in person appointments. The patient expressed understanding and agreed to proceed.  Virtual Visit via Video Note is as below  I connected with Summer Cooper, on 12/25/18 at 1045 by telephone and verified that I am speaking with the correct person using two identifiers. Unable to perform video visit as patient did not have equipment.    I have discussed with the patient regarding the safety during COVID Pandemic and steps and precautions including social distancing with the patient.    HPI: Summer Cooper  is a 68 y.o. female  With hypertension, history of migraine headaches, osteoarthritis, recently evaluated by [place] for perioperative cardiac risk stratification for upcoming left shoulder arthroplasty with Dr. Veverly Fells and for abnormal EKG.  She underwent echocardiogram and now presents for follow up. In view of good functional capacity, stress testing was not performed. Reports that her lipids are borderline, but not on therapy as her HDL is good. No history of diabetes or thyroid disorders.  She does not exercise, but is fairly active. She does climb stairs at home that she tolerates well. Also does yard work and states  that she is always busy doing something around her house. No chest pain or shortness of breath. Has chronic leg swelling when on her feet at the end of the day.   No former tobacco use. She drinks 5 glasses of wine a week. No illicit drug use. 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   Past Medical History:  Diagnosis Date  . Arthritis   . 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 Cataracts, bilateral    mild, no surgery yet  . Complication of anesthesia    hard to wake up (DURING TUBAL LIGATION.  OTHER SURGERIES WERE FINE)  . Headache    last flare up 04/06/2017  . Hearing loss    mild case - no aids used  . HTN (hypertension) 11/07/2018  . Seasonal allergies     Past Surgical History:  Procedure Laterality Date  . BUNIONECTOMY  2011  . COLONOSCOPY    . I&D EXTREMITY     cat bite-rt wrist  . JOINT REPLACEMENT    . REVERSE SHOULDER ARTHROPLASTY Left 12/22/2018   Procedure: REVERSE SHOULDER ARTHROPLASTY;  Surgeon: Netta Cedars, MD;  Location: [facility];  Service: Orthopedics;  Laterality: Left;  interscalene block  . TARSAL METATARSAL ARTHRODESIS  05/13/2011   Procedure: TARSAL METATARSAL FUSION;  Surgeon: Wylene Simmer, MD;  Location: [facility];  Service: Orthopedics;  Laterality: Right;  right Lapidus 1st tarsal metatarsal arthrodesis, right 2nd hammer toe correction, mcbride bunionectomy, 2nd metatarsal weil osteotomy  . TONSILLECTOMY    .  TOTAL SHOULDER ARTHROPLASTY Right 05/27/2017   Procedure: RIGHT TOTAL SHOULDER ARTHROPLASTY;  Surgeon: Netta Cedars, MD;  Location: [facility];  Service: Orthopedics;  Laterality: Right;  . TUBAL LIGATION      Social History   Socioeconomic History  . Marital status: Married    Spouse name: Not on file  . Number of children: 1  . Years of education: Not on file  . Highest education level: Not on file  Occupational History  . Not on file  Social Needs  . Financial resource strain:  Not on file  . Food insecurity    Worry: Not on file    Inability: Not on file  . Transportation needs    Medical: Not on file    Non-medical: Not on file  Tobacco Use  . Smoking status: Never Smoker  . Smokeless tobacco: Never Used  Substance and Sexual Activity  . Alcohol use: Yes    Alcohol/week: 3.0 - 4.0 standard drinks    Types: 3 - 4 Glasses of wine per week    Comment: occ  . Drug use: No  . Sexual activity: Not on file  Lifestyle  . Physical activity    Days per week: Not on file    Minutes per session: Not on file  . Stress: Not on file  Relationships  . Social Herbalist on phone: Not on file    Gets together: Not on file    Attends religious service: Not on file    Active member of club or organization: Not on file    Attends meetings of clubs or organizations: Not on file    Relationship status: Not on file  . Intimate partner violence    Fear of current or ex partner: Not on file    Emotionally abused: Not on file    Physically abused: Not on file    Forced sexual activity: Not on file  Other Topics Concern  . Not on file  Social History Narrative  . Not on file    Review of Systems  Constitution: Negative for decreased appetite, malaise/fatigue, weight gain and weight loss.  Eyes: Negative for visual disturbance.  Cardiovascular: Negative for chest pain, claudication, dyspnea on exertion, leg swelling, orthopnea, palpitations and syncope.  Respiratory: Negative for hemoptysis and wheezing.   Endocrine: Negative for cold intolerance and heat intolerance.  Hematologic/Lymphatic: Does not bruise/bleed easily.  Skin: Negative for nail changes.  Musculoskeletal: Negative for muscle weakness and myalgias.  Gastrointestinal: Negative for abdominal pain, change in bowel habit, nausea and vomiting.  Neurological: Negative for difficulty with concentration, dizziness, focal weakness and headaches.  Psychiatric/Behavioral: Negative for altered mental  status and suicidal ideas.  All other systems reviewed and are negative.     Objective:  Blood pressure 128/73, pulse 73, height 5' (1.524 m), weight 120 lb (54.4 kg). Body mass index is 23.44 kg/m.    Physical Exam  Constitutional: She is oriented to person, place, and time. Vital signs are normal. She appears well-developed and well-nourished.  HENT:  Head: Normocephalic and atraumatic.  Neck: Normal range of motion.  Cardiovascular: Normal rate, regular rhythm and intact distal pulses.  Murmur heard. High-pitched blowing decrescendo early diastolic murmur is present with a grade of 2/6 at the upper right sternal border radiating to the apex. Pulmonary/Chest: Effort normal and breath sounds normal. No accessory muscle usage. No respiratory distress.  Abdominal: Soft. Bowel sounds are normal.  Musculoskeletal: Normal range of motion.  Neurological: She  is alert and oriented to person, place, and time.  Skin: Skin is warm and dry.  Vitals reviewed.  Radiology: No results found.  Laboratory examination:   PCP labs 05/29/2018: RBC 3.92, normal H&H, CBC otherwise normal.  Creatinine 0.92, EGFR 60/73, potassium 4.1, CMP normal.  TSH 2.68.  Cholesterol 253, triglycerides 134, HDL 105, LDL 121.  CMP Latest Ref Rng & Units 12/23/2018 12/19/2018 05/28/2017  Glucose 70 - 99 mg/dL 126(H) 111(H) 116(H)  BUN 8 - 23 mg/dL 19 45(H) 24(H)  Creatinine 0.44 - 1.00 mg/dL 0.90 1.36(H) 1.10(H)  Sodium 135 - 145 mmol/L 136 136 135  Potassium 3.5 - 5.1 mmol/L 3.3(L) 4.5 2.9(L)  Chloride 98 - 111 mmol/L 101 100 99(L)  CO2 22 - 32 mmol/L 28 25 25   Calcium 8.9 - 10.3 mg/dL 8.8(L) 9.6 8.3(L)   CBC Latest Ref Rng & Units 12/23/2018 12/19/2018 05/28/2017  WBC 4.0 - 10.5 K/uL - 7.0 -  Hemoglobin 12.0 - 15.0 g/dL 9.4(L) 12.5 9.8(L)  Hematocrit 36.0 - 46.0 % 28.8(L) 37.8 28.9(L)  Platelets 150 - 400 K/uL - 303 -   Lipid Panel  No results found for: CHOL, TRIG, HDL, CHOLHDL, VLDL, LDLCALC, LDLDIRECT HEMOGLOBIN  A1C No results found for: HGBA1C, MPG TSH No results for input(s): TSH in the last 8760 hours.  PRN Meds:. There are no discontinued medications. Current Meds  Medication Sig  . acetaminophen (TYLENOL) 500 MG tablet Take 1,000 mg by mouth every 8 (eight) hours as needed for headache.  . Calcium Carbonate-Vitamin D (CALCIUM-VITAMIN D) 600-125 MG-UNIT TABS Take 1 tablet by mouth daily.  . cetirizine (ZYRTEC) 10 MG tablet Take 10 mg by mouth daily as needed for allergies.  Marland Kitchen diclofenac (VOLTAREN) 75 MG EC tablet Take 75 mg by mouth 2 (two) times daily.  . diclofenac sodium (VOLTAREN) 1 % GEL Place 2 g onto the skin 4 (four) times daily as needed (pain).   . fluticasone (FLONASE) 50 MCG/ACT nasal spray Place 2 sprays into both nostrils daily as needed for allergies or rhinitis.  . hydrochlorothiazide (MICROZIDE) 12.5 MG capsule Take 12.5 mg by mouth daily.  Marland Kitchen lisinopril (PRINIVIL,ZESTRIL) 5 MG tablet Take 5 mg by mouth daily.  . SUMAtriptan (IMITREX) 50 MG tablet Take 50 mg by mouth every 2 (two) hours as needed for migraine.   . traMADol (ULTRAM) 50 MG tablet Take 50 mg by mouth daily as needed for moderate pain.    Cardiac Studies:   Echocardiogram 12/12/2018: Left ventricle cavity is normal in size. Normal left ventricular wall thickness. Normal LV systolic function with EF 67%. Normal global wall motion. Indeterminate diastolic filling pattern.  Left atrial cavity is normal in size. 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 Trileaflet aortic valve. Moderate (Grade III) aortic regurgitation. Mild tricuspid regurgitation. Estimated pulmonary artery systolic pressure is 25 mmHg.  Assessment:     ICD-10-CM   1. Moderate aortic regurgitation  I35.1   2. Abnormal EKG  R94.31   3. Essential hypertension  I10      EKG 11/09/2018: Normal sinus rhythm at 69 bpm, normal axis, PRWP, cannot exclude anterior infarct old. No evidence of ischemia.  Previously noted T wave inversion in V3 on PCP EKG, not noted.   PCP EKG 11/07/2018: Normal sinus rhythm at 83 bpm, normal axis, poor R wave progression cannot exclude anterior infarct old.  T wave inversion in V3, cannot exclude anterior ischemia.  Abnormal EKG.  Recommendations:   I discussed echocardiogram  results with the patient, has normal LVEF.  She is noted to have moderate aortic regurgitation.  She is asymptomatic in regards to this.  She will need continued surveillance of this.  Blood pressure is well controlled with lisinopril hydrochlorothiazide, will continue the same for now in view of upcoming surgery, but could potentially change to calcium channel blocker given her aortic regurgitation.  No wall motion abnormalities were noted despite her abnormal KG.  She is scheduled for surgery tomorrow.  Continue to feel that she did not need stress testing good functional capacity and lack of symptoms.  We will see her back in 6 months for follow-up, but encouraged her to contact me sooner if needed.  Miquel Dunn, MSN, APRN, FNP-C [facility]. [place] Office: [phone] Fax: [phone]

## 2018-12-22 ENCOUNTER — Other Ambulatory Visit: Payer: Self-pay

## 2018-12-22 ENCOUNTER — Encounter (HOSPITAL_COMMUNITY): Payer: Self-pay

## 2018-12-22 ENCOUNTER — Inpatient Hospital Stay (HOSPITAL_COMMUNITY)
Admission: RE | Admit: 2018-12-22 | Discharge: 2018-12-23 | DRG: 483 | Disposition: A | Discharge status: Home / Self Care | Payer: Medicare Other | Attending: Orthopedic Surgery | Admitting: Orthopedic Surgery

## 2018-12-22 ENCOUNTER — Inpatient Hospital Stay (HOSPITAL_COMMUNITY): Payer: Medicare Other | Admitting: Physician Assistant

## 2018-12-22 ENCOUNTER — Encounter (HOSPITAL_COMMUNITY)
Admission: RE | Disposition: A | Discharge status: Home / Self Care | Payer: Self-pay | Source: Home / Self Care | Attending: Orthopedic Surgery

## 2018-12-22 ENCOUNTER — Inpatient Hospital Stay (HOSPITAL_COMMUNITY): Payer: Medicare Other | Admitting: Anesthesiology

## 2018-12-22 ENCOUNTER — Inpatient Hospital Stay (HOSPITAL_COMMUNITY): Payer: Medicare Other

## 2018-12-22 DIAGNOSIS — M25812 Other specified joint disorders, left shoulder: Secondary | ICD-10-CM | POA: Diagnosis present

## 2018-12-22 DIAGNOSIS — I10 Essential (primary) hypertension: Secondary | ICD-10-CM | POA: Diagnosis present

## 2018-12-22 DIAGNOSIS — M19012 Primary osteoarthritis, left shoulder: Principal | ICD-10-CM | POA: Diagnosis present

## 2018-12-22 DIAGNOSIS — Z79899 Other long term (current) drug therapy: Secondary | ICD-10-CM

## 2018-12-22 DIAGNOSIS — Z981 Arthrodesis status: Secondary | ICD-10-CM | POA: Diagnosis not present

## 2018-12-22 DIAGNOSIS — Z96612 Presence of left artificial shoulder joint: Secondary | ICD-10-CM

## 2018-12-22 HISTORY — PX: REVERSE SHOULDER ARTHROPLASTY: SHX5054

## 2018-12-22 SURGERY — ARTHROPLASTY, SHOULDER, TOTAL, REVERSE
Anesthesia: General | Site: Shoulder | Laterality: Left

## 2018-12-22 MED ORDER — ONDANSETRON HCL 4 MG/2ML IJ SOLN: 4.0000 mg | Freq: Once | INTRAMUSCULAR | Status: DC | PRN

## 2018-12-22 MED ORDER — EPHEDRINE 5 MG/ML INJ
INTRAVENOUS | Status: AC
  Filled 2018-12-22: qty 10

## 2018-12-22 MED ORDER — PROPOFOL 10 MG/ML IV BOLUS
INTRAVENOUS | Status: AC
  Filled 2018-12-22: qty 20

## 2018-12-22 MED ORDER — FENTANYL CITRATE (PF) 100 MCG/2ML IJ SOLN
INTRAMUSCULAR | Status: AC
  Filled 2018-12-22: qty 2

## 2018-12-22 MED ORDER — HYDROCODONE-ACETAMINOPHEN 5-325 MG PO TABS: 1.0000 | ORAL_TABLET | Freq: Four times a day (QID) | ORAL | 0 refills | Status: DC | PRN

## 2018-12-22 MED ORDER — PHENYLEPHRINE 40 MCG/ML (10ML) SYRINGE FOR IV PUSH (FOR BLOOD PRESSURE SUPPORT)
PREFILLED_SYRINGE | INTRAVENOUS | Status: AC
  Filled 2018-12-22: qty 10

## 2018-12-22 MED ORDER — FENTANYL CITRATE (PF) 100 MCG/2ML IJ SOLN
25.0000 ug | INTRAMUSCULAR | Status: DC | PRN
  Administered 2018-12-22 (×3): 50 ug via INTRAVENOUS

## 2018-12-22 MED ORDER — MENTHOL 3 MG MT LOZG
1.0000 | LOZENGE | OROMUCOSAL | Status: DC | PRN
  Administered 2018-12-22: 12:00:00 3 mg via ORAL
  Filled 2018-12-22: qty 9

## 2018-12-22 MED ORDER — 0.9 % SODIUM CHLORIDE (POUR BTL) OPTIME
TOPICAL | Status: DC | PRN
  Administered 2018-12-22: 08:00:00 1000 mL

## 2018-12-22 MED ORDER — BUPIVACAINE HCL (PF) 0.25 % IJ SOLN
INTRAMUSCULAR | Status: AC
  Filled 2018-12-22: qty 30

## 2018-12-22 MED ORDER — BUPIVACAINE LIPOSOME 1.3 % IJ SUSP
INTRAMUSCULAR | Status: DC | PRN
  Administered 2018-12-22: 10 mL via PERINEURAL

## 2018-12-22 MED ORDER — ACETAMINOPHEN 500 MG PO TABS: 1000.0000 mg | ORAL_TABLET | Freq: Three times a day (TID) | ORAL | Status: DC | PRN

## 2018-12-22 MED ORDER — DEXAMETHASONE SODIUM PHOSPHATE 10 MG/ML IJ SOLN
INTRAMUSCULAR | Status: AC
  Filled 2018-12-22: qty 1

## 2018-12-22 MED ORDER — MORPHINE SULFATE (PF) 2 MG/ML IV SOLN: 0.5000 mg | INTRAVENOUS | Status: DC | PRN

## 2018-12-22 MED ORDER — DOCUSATE SODIUM 100 MG PO CAPS
100.0000 mg | ORAL_CAPSULE | Freq: Two times a day (BID) | ORAL | Status: DC
  Administered 2018-12-22 – 2018-12-23 (×2): 100 mg via ORAL
  Filled 2018-12-22 (×2): qty 1

## 2018-12-22 MED ORDER — SUMATRIPTAN SUCCINATE 50 MG PO TABS
50.0000 mg | ORAL_TABLET | ORAL | Status: DC | PRN
  Filled 2018-12-22: qty 1

## 2018-12-22 MED ORDER — BUPIVACAINE HCL (PF) 0.5 % IJ SOLN
INTRAMUSCULAR | Status: DC | PRN
  Administered 2018-12-22: 15 mL via PERINEURAL

## 2018-12-22 MED ORDER — CALCIUM CARBONATE-VITAMIN D 500-200 MG-UNIT PO TABS
1.0000 | ORAL_TABLET | Freq: Every day | ORAL | Status: DC
  Administered 2018-12-22 – 2018-12-23 (×2): 1 via ORAL
  Filled 2018-12-22 (×2): qty 1

## 2018-12-22 MED ORDER — METHOCARBAMOL 500 MG IVPB - SIMPLE MED
500.0000 mg | Freq: Four times a day (QID) | INTRAVENOUS | Status: DC | PRN
  Administered 2018-12-22: 500 mg via INTRAVENOUS
  Filled 2018-12-22: qty 50

## 2018-12-22 MED ORDER — BUPIVACAINE-EPINEPHRINE 0.5% -1:200000 IJ SOLN
INTRAMUSCULAR | Status: AC
  Filled 2018-12-22: qty 1

## 2018-12-22 MED ORDER — DICLOFENAC SODIUM 75 MG PO TBEC: 75.0000 mg | DELAYED_RELEASE_TABLET | Freq: Two times a day (BID) | ORAL | Status: DC

## 2018-12-22 MED ORDER — METOCLOPRAMIDE HCL 5 MG PO TABS: 5.0000 mg | ORAL_TABLET | Freq: Three times a day (TID) | ORAL | Status: DC | PRN

## 2018-12-22 MED ORDER — MIDAZOLAM HCL 2 MG/2ML IJ SOLN
INTRAMUSCULAR | Status: AC
  Filled 2018-12-22: qty 2

## 2018-12-22 MED ORDER — HYDROCHLOROTHIAZIDE 12.5 MG PO CAPS
12.5000 mg | ORAL_CAPSULE | Freq: Every day | ORAL | Status: DC
  Administered 2018-12-23: 12.5 mg via ORAL
  Filled 2018-12-22: qty 1

## 2018-12-22 MED ORDER — FENTANYL CITRATE (PF) 250 MCG/5ML IJ SOLN
INTRAMUSCULAR | Status: DC | PRN
  Administered 2018-12-22 (×4): 50 ug via INTRAVENOUS

## 2018-12-22 MED ORDER — BUPIVACAINE-EPINEPHRINE (PF) 0.5% -1:200000 IJ SOLN
INTRAMUSCULAR | Status: AC
  Filled 2018-12-22: qty 1.8

## 2018-12-22 MED ORDER — LIDOCAINE 2% (20 MG/ML) 5 ML SYRINGE
INTRAMUSCULAR | Status: DC | PRN
  Administered 2018-12-22: 100 mg via INTRAVENOUS

## 2018-12-22 MED ORDER — LIDOCAINE 2% (20 MG/ML) 5 ML SYRINGE
INTRAMUSCULAR | Status: AC
  Filled 2018-12-22: qty 5

## 2018-12-22 MED ORDER — MIDAZOLAM HCL 5 MG/5ML IJ SOLN
INTRAMUSCULAR | Status: DC | PRN
  Administered 2018-12-22 (×2): 1 mg via INTRAVENOUS

## 2018-12-22 MED ORDER — POLYETHYLENE GLYCOL 3350 17 G PO PACK: 17.0000 g | PACK | Freq: Every day | ORAL | Status: DC | PRN

## 2018-12-22 MED ORDER — ACETAMINOPHEN 325 MG PO TABS: 325.0000 mg | ORAL_TABLET | Freq: Four times a day (QID) | ORAL | Status: DC | PRN

## 2018-12-22 MED ORDER — METHOCARBAMOL 500 MG IVPB - SIMPLE MED
INTRAVENOUS | Status: AC
  Filled 2018-12-22: qty 50

## 2018-12-22 MED ORDER — HYDROCODONE-ACETAMINOPHEN 5-325 MG PO TABS
1.0000 | ORAL_TABLET | ORAL | Status: DC | PRN
  Administered 2018-12-22 (×2): 1 via ORAL
  Administered 2018-12-23: 2 via ORAL
  Filled 2018-12-22: qty 2
  Filled 2018-12-22 (×2): qty 1

## 2018-12-22 MED ORDER — ONDANSETRON HCL 4 MG/2ML IJ SOLN
INTRAMUSCULAR | Status: AC
  Filled 2018-12-22: qty 2

## 2018-12-22 MED ORDER — LORATADINE 10 MG PO TABS
10.0000 mg | ORAL_TABLET | Freq: Every day | ORAL | Status: DC
  Filled 2018-12-22: qty 1

## 2018-12-22 MED ORDER — ONDANSETRON HCL 4 MG/2ML IJ SOLN: 4.0000 mg | Freq: Four times a day (QID) | INTRAMUSCULAR | Status: DC | PRN

## 2018-12-22 MED ORDER — ONDANSETRON HCL 4 MG PO TABS: 4.0000 mg | ORAL_TABLET | Freq: Four times a day (QID) | ORAL | Status: DC | PRN

## 2018-12-22 MED ORDER — SODIUM CHLORIDE 0.9 % IV SOLN: INTRAVENOUS | Status: DC

## 2018-12-22 MED ORDER — LISINOPRIL 5 MG PO TABS
5.0000 mg | ORAL_TABLET | Freq: Every day | ORAL | Status: DC
  Filled 2018-12-22 (×2): qty 1

## 2018-12-22 MED ORDER — SUCCINYLCHOLINE CHLORIDE 200 MG/10ML IV SOSY
PREFILLED_SYRINGE | INTRAVENOUS | Status: DC | PRN
  Administered 2018-12-22: 100 mg via INTRAVENOUS

## 2018-12-22 MED ORDER — OXYCODONE HCL 5 MG/5ML PO SOLN: 5.0000 mg | Freq: Once | ORAL | Status: DC | PRN

## 2018-12-22 MED ORDER — METOCLOPRAMIDE HCL 5 MG/ML IJ SOLN: 5.0000 mg | Freq: Three times a day (TID) | INTRAMUSCULAR | Status: DC | PRN

## 2018-12-22 MED ORDER — PHENOL 1.4 % MT LIQD
1.0000 | OROMUCOSAL | Status: DC | PRN
  Filled 2018-12-22: qty 177

## 2018-12-22 MED ORDER — ONDANSETRON HCL 4 MG/2ML IJ SOLN
INTRAMUSCULAR | Status: DC | PRN
  Administered 2018-12-22: 4 mg via INTRAVENOUS

## 2018-12-22 MED ORDER — SODIUM CHLORIDE 0.9 % IV SOLN
INTRAVENOUS | Status: DC | PRN
  Administered 2018-12-22: 08:00:00 25 ug/min via INTRAVENOUS

## 2018-12-22 MED ORDER — OXYCODONE HCL 5 MG PO TABS: 5.0000 mg | ORAL_TABLET | Freq: Once | ORAL | Status: DC | PRN

## 2018-12-22 MED ORDER — FLUTICASONE PROPIONATE 50 MCG/ACT NA SUSP: 2.0000 | Freq: Every day | NASAL | Status: DC | PRN

## 2018-12-22 MED ORDER — EPHEDRINE SULFATE-NACL 50-0.9 MG/10ML-% IV SOSY
PREFILLED_SYRINGE | INTRAVENOUS | Status: DC | PRN
  Administered 2018-12-22 (×2): 5 mg via INTRAVENOUS

## 2018-12-22 MED ORDER — METHOCARBAMOL 500 MG PO TABS
500.0000 mg | ORAL_TABLET | Freq: Four times a day (QID) | ORAL | Status: DC | PRN
  Administered 2018-12-22: 500 mg via ORAL
  Filled 2018-12-22 (×2): qty 1

## 2018-12-22 MED ORDER — CEFAZOLIN SODIUM-DEXTROSE 2-4 GM/100ML-% IV SOLN
2.0000 g | INTRAVENOUS | Status: AC
  Administered 2018-12-22: 08:00:00 2 g via INTRAVENOUS
  Filled 2018-12-22: qty 100

## 2018-12-22 MED ORDER — LACTATED RINGERS IV SOLN
INTRAVENOUS | Status: DC
  Administered 2018-12-22 (×2): via INTRAVENOUS

## 2018-12-22 MED ORDER — CEFAZOLIN SODIUM-DEXTROSE 2-4 GM/100ML-% IV SOLN
2.0000 g | Freq: Four times a day (QID) | INTRAVENOUS | Status: AC
  Administered 2018-12-22 – 2018-12-23 (×3): 2 g via INTRAVENOUS
  Filled 2018-12-22 (×3): qty 100

## 2018-12-22 MED ORDER — STERILE WATER FOR IRRIGATION IR SOLN
Status: DC | PRN
  Administered 2018-12-22: 2000 mL

## 2018-12-22 MED ORDER — DEXAMETHASONE SODIUM PHOSPHATE 10 MG/ML IJ SOLN
INTRAMUSCULAR | Status: DC | PRN
  Administered 2018-12-22: 5 mg via INTRAVENOUS

## 2018-12-22 MED ORDER — PHENYLEPHRINE HCL-NACL 10-0.9 MG/250ML-% IV SOLN
INTRAVENOUS | Status: AC
  Filled 2018-12-22: qty 250

## 2018-12-22 MED ORDER — PROPOFOL 10 MG/ML IV BOLUS
INTRAVENOUS | Status: DC | PRN
  Administered 2018-12-22: 120 mg via INTRAVENOUS

## 2018-12-22 MED ORDER — METHOCARBAMOL 500 MG PO TABS: 500.0000 mg | ORAL_TABLET | Freq: Three times a day (TID) | ORAL | 1 refills | Status: DC | PRN

## 2018-12-22 MED ORDER — TRAMADOL HCL 50 MG PO TABS
50.0000 mg | ORAL_TABLET | Freq: Every day | ORAL | Status: DC | PRN
  Administered 2018-12-23: 50 mg via ORAL
  Filled 2018-12-22: qty 1

## 2018-12-22 SURGICAL SUPPLY — 71 items
BAG ZIPLOCK 12X15 (MISCELLANEOUS) ×3 IMPLANT
BIT DRILL 1.6MX128 (BIT) ×1 IMPLANT
BIT DRILL 1.6MX128MM (BIT) ×1
BIT DRILL 170X2.5X (BIT) IMPLANT
BIT DRL 170X2.5X (BIT) ×1
BLADE SAG 18X100X1.27 (BLADE) ×3 IMPLANT
CLOSURE WOUND 1/2 X4 (GAUZE/BANDAGES/DRESSINGS) ×1
COVER BACK TABLE 60X90IN (DRAPES) ×3 IMPLANT
COVER SURGICAL LIGHT HANDLE (MISCELLANEOUS) ×3 IMPLANT
COVER WAND RF STERILE (DRAPES) ×2 IMPLANT
DECANTER SPIKE VIAL GLASS SM (MISCELLANEOUS) ×3 IMPLANT
DRAPE INCISE IOBAN 66X45 STRL (DRAPES) ×3 IMPLANT
DRAPE ORTHO SPLIT 77X108 STRL (DRAPES) ×4
DRAPE SHEET LG 3/4 BI-LAMINATE (DRAPES) ×3 IMPLANT
DRAPE SURG ORHT 6 SPLT 77X108 (DRAPES) ×2 IMPLANT
DRAPE U-SHAPE 47X51 STRL (DRAPES) ×3 IMPLANT
DRILL 2.5 (BIT) ×2
DRSG ADAPTIC 3X8 NADH LF (GAUZE/BANDAGES/DRESSINGS) ×3 IMPLANT
DRSG PAD ABDOMINAL 8X10 ST (GAUZE/BANDAGES/DRESSINGS) ×3 IMPLANT
DURAPREP 26ML APPLICATOR (WOUND CARE) ×3 IMPLANT
ELECT BLADE TIP CTD 4 INCH (ELECTRODE) ×3 IMPLANT
ELECT NDL TIP 2.8 STRL (NEEDLE) ×1 IMPLANT
ELECT NEEDLE TIP 2.8 STRL (NEEDLE) ×3 IMPLANT
ELECT REM PT RETURN 15FT ADLT (MISCELLANEOUS) ×3 IMPLANT
EPI LT SZ 1 (Orthopedic Implant) ×3 IMPLANT
EPIPHYSIS LT SZ 1 (Orthopedic Implant) IMPLANT
GAUZE SPONGE 4X4 12PLY STRL (GAUZE/BANDAGES/DRESSINGS) ×3 IMPLANT
GLENOSPHERE XTEND RSA 38 EC +4 (Joint) ×2 IMPLANT
GLOVE BIOGEL PI ORTHO PRO 7.5 (GLOVE) ×2
GLOVE BIOGEL PI ORTHO PRO SZ8 (GLOVE) ×2
GLOVE ORTHO TXT STRL SZ7.5 (GLOVE) ×3 IMPLANT
GLOVE PI ORTHO PRO STRL 7.5 (GLOVE) ×1 IMPLANT
GLOVE PI ORTHO PRO STRL SZ8 (GLOVE) ×1 IMPLANT
GLOVE SURG ORTHO 8.5 STRL (GLOVE) ×3 IMPLANT
GOWN STRL REUS W/TWL XL LVL3 (GOWN DISPOSABLE) ×6 IMPLANT
KIT BASIN OR (CUSTOM PROCEDURE TRAY) ×3 IMPLANT
KIT TURNOVER KIT A (KITS) IMPLANT
MANIFOLD NEPTUNE II (INSTRUMENTS) ×3 IMPLANT
METAGLENE DELTA EXTEND (Trauma) IMPLANT
METAGLENE DXTEND (Trauma) ×3 IMPLANT
NDL MAYO CATGUT SZ4 TPR NDL (NEEDLE) IMPLANT
NEEDLE MAYO CATGUT SZ4 (NEEDLE) IMPLANT
NS IRRIG 1000ML POUR BTL (IV SOLUTION) ×3 IMPLANT
PACK SHOULDER (CUSTOM PROCEDURE TRAY) ×3 IMPLANT
PIN GUIDE 1.2 (PIN) ×2 IMPLANT
PIN GUIDE GLENOPHERE 1.5MX300M (PIN) ×2 IMPLANT
PIN METAGLENE 2.5 (PIN) ×2 IMPLANT
PROTECTOR NERVE ULNAR (MISCELLANEOUS) ×3 IMPLANT
RESTRAINT HEAD UNIVERSAL NS (MISCELLANEOUS) ×3 IMPLANT
SCREW 4.5X24MM (Screw) ×2 IMPLANT
SCREW 4.5X36MM (Screw) ×2 IMPLANT
SCREW BN 24X4.5XLCK STRL (Screw) IMPLANT
SLING ARM FOAM STRAP LRG (SOFTGOODS) ×2 IMPLANT
SMARTMIX MINI TOWER (MISCELLANEOUS)
SPACER 38 PLUS 3 (Spacer) ×2 IMPLANT
SPONGE LAP 4X18 RFD (DISPOSABLE) IMPLANT
STEM HUMERAL SZ8 STANDARD (Stem) ×3 IMPLANT
STEM HUMERAL SZ8 STD (Stem) IMPLANT
STRIP CLOSURE SKIN 1/2X4 (GAUZE/BANDAGES/DRESSINGS) ×2 IMPLANT
SUCTION FRAZIER HANDLE 10FR (MISCELLANEOUS) ×2
SUCTION TUBE FRAZIER 10FR DISP (MISCELLANEOUS) ×1 IMPLANT
SUT FIBERWIRE #2 38 T-5 BLUE (SUTURE) ×9
SUT MNCRL AB 4-0 PS2 18 (SUTURE) ×3 IMPLANT
SUT VIC AB 0 CT1 36 (SUTURE) ×6 IMPLANT
SUT VIC AB 0 CT2 27 (SUTURE) ×3 IMPLANT
SUT VIC AB 2-0 CT1 27 (SUTURE) ×2
SUT VIC AB 2-0 CT1 TAPERPNT 27 (SUTURE) ×1 IMPLANT
SUTURE FIBERWR #2 38 T-5 BLUE (SUTURE) ×1 IMPLANT
TOWEL OR 17X26 10 PK STRL BLUE (TOWEL DISPOSABLE) ×3 IMPLANT
TOWER SMARTMIX MINI (MISCELLANEOUS) IMPLANT
YANKAUER SUCT BULB TIP 10FT TU (MISCELLANEOUS) ×3 IMPLANT

## 2018-12-22 NOTE — Transfer of Care (Signed)
Immediate Anesthesia Transfer of Care Note  Patient: Summer Cooper  Procedure(s) Performed: REVERSE SHOULDER ARTHROPLASTY (Left Shoulder)  Patient Location: PACU  Anesthesia Type:General and GA combined with regional for post-op pain  Level of Consciousness: awake, drowsy and responds to stimulation  Airway & Oxygen Therapy: Patient Spontanous Breathing and Patient connected to face mask oxygen  Post-op Assessment: Report given to RN and Post -op Vital signs reviewed and stable  Post vital signs: Reviewed and stable  Last Vitals:  Vitals Value Taken Time  BP 143/82 12/22/18 0946  Temp    Pulse 109 12/22/18 0948  Resp 34 12/22/18 0948  SpO2 100 % 12/22/18 0948  Vitals shown include unvalidated device data.  Last Pain:  Vitals:   12/22/18 0610  TempSrc:   PainSc: 0-No pain         Complications: No apparent anesthesia complications

## 2018-12-22 NOTE — Plan of Care (Signed)
Plan of care for day of surgery discussed with patient. Will continue to monitor.

## 2018-12-22 NOTE — Anesthesia Procedure Notes (Signed)
Anesthesia Regional Block: Interscalene brachial plexus block   Pre-Anesthetic Checklist: ,, timeout performed, Correct Patient, Correct Site, Correct Laterality, Correct Procedure, Correct Position, site marked, Risks and benefits discussed,  Surgical consent,  Pre-op evaluation,  At surgeon's request and post-op pain management  Laterality: Left  Prep: chloraprep       Needles:  Injection technique: Single-shot  Needle Type: Echogenic Needle     Needle Length: 5cm  Needle Gauge: 21     Additional Needles:   Narrative:  Start time: 12/22/2018 7:04 AM End time: 12/22/2018 7:08 AM Injection made incrementally with aspirations every 5 mL.  Performed by: Personally  Anesthesiologist: Audry Pili, MD  Additional Notes: No pain on injection. No increased resistance to injection. Injection made in 5cc increments. Good needle visualization. Patient tolerated the procedure well.

## 2018-12-22 NOTE — Anesthesia Procedure Notes (Signed)
Procedure Name: Intubation Date/Time: 12/22/2018 7:49 AM Performed by: Lollie Sails, CRNA Pre-anesthesia Checklist: Patient identified, Emergency Drugs available, Suction available, Patient being monitored and Timeout performed Patient Re-evaluated:Patient Re-evaluated prior to induction Oxygen Delivery Method: Circle system utilized Preoxygenation: Pre-oxygenation with 100% oxygen Induction Type: IV induction Laryngoscope Size: Glidescope and 4 Grade View: Grade I Tube type: Oral Tube size: 7.5 mm Number of attempts: 1 Airway Equipment and Method: Stylet and Video-laryngoscopy Placement Confirmation: ETT inserted through vocal cords under direct vision,  positive ETCO2 and breath sounds checked- equal and bilateral Secured at: 22 cm Tube secured with: Tape Dental Injury: Teeth and Oropharynx as per pre-operative assessment  Comments: Glidescope used as first attempt due to use in prior cases.

## 2018-12-22 NOTE — Discharge Instructions (Signed)
Ice to the shoulder constantly.  Keep the incision covered and clean and dry for one week, then ok to get it wet in the shower. ° °Do exercise as instructed several times per day. ° °DO NOT reach behind your back or push up out of a chair with the operative arm. ° °Use a sling while you are up and around for comfort, may remove while seated.  Keep pillow propped behind the operative elbow. ° °Follow up with Dr Jarae Nemmers in two weeks in the office, call [phone] for appt °

## 2018-12-22 NOTE — Anesthesia Postprocedure Evaluation (Signed)
Anesthesia Post Note  Patient: Summer Cooper  Procedure(s) Performed: REVERSE SHOULDER ARTHROPLASTY (Left Shoulder)     Patient location during evaluation: PACU Anesthesia Type: General Level of consciousness: awake and alert Pain management: pain level controlled Vital Signs Assessment: post-procedure vital signs reviewed and stable Respiratory status: spontaneous breathing, nonlabored ventilation, respiratory function stable and patient connected to nasal cannula oxygen Cardiovascular status: blood pressure returned to baseline and stable Postop Assessment: no apparent nausea or vomiting Anesthetic complications: no    Last Vitals:  Vitals:   12/22/18 1030 12/22/18 1045  BP: 121/74 123/73  Pulse: (!) 102 92  Resp: 16 13  Temp:  36.9 C  SpO2: 100% 100%    Last Pain:  Vitals:   12/22/18 1045  TempSrc:   PainSc: [place]

## 2018-12-22 NOTE — Interval H&P Note (Signed)
History and Physical Interval Note:  12/22/2018 7:32 AM  Summer Cooper  has presented today for surgery, with the diagnosis of Left shoulder cuff arthropathy.  The various methods of treatment have been discussed with the patient and family. After consideration of risks, benefits and other options for treatment, the patient has consented to  Procedure(s) with comments: REVERSE SHOULDER ARTHROPLASTY (Left) - interscalene block as a surgical intervention.  The patient's history has been reviewed, patient examined, no change in status, stable for surgery.  I have reviewed the patient's chart and labs.  Questions were answered to the patient's satisfaction.     Augustin Schooling

## 2018-12-22 NOTE — Op Note (Signed)
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  PROCEDURE PERFORMED:  Left reverse total shoulder arthroplasty with subscapularis repair using DePuy Delta Xtend prosthesis.  ATTENDING SURGEON:  Esmond Plants, MD  ASSISTANT:  Darol Destine, [place], who was scrubbed during the entire procedure and necessary for satisfactory completion of surgery.  ANESTHESIA:  General anesthesia was used plus interscalene block.  ESTIMATED BLOOD LOSS:  Less than 100 mL.  FLUID REPLACEMENT:  1500 mL crystalloid.  INSTRUMENT COUNTS:  Correct.  COMPLICATIONS:  There were no complications.  ANTIBIOTICS:  Perioperative antibiotics were given.  INDICATIONS:  The patient is a 68 year old female with worsening left shoulder pain and dysfunction secondary to end-stage arthritis.  The patient has bone-on-bone erosion with severe retroversion of the glenoid with a B2 glenoid bone loss.  The patient  also has advanced arthritis affecting the humeral head with significant cystic degeneration and flattening.  Having failed an extended period of conservative management and over concerns about significant atrophy and rotator cuff, we elected to proceed  with reverse shoulder replacement.  The patient agreed with this plan.  Informed consent obtained.  DESCRIPTION OF PROCEDURE:  After an adequate level of anesthesia was achieved, the patient was positioned in the modified beach chair position.  Left shoulder correctly identified and sterilely prepped and draped in the usual manner.  Time-out called,  verifying correct patient, correct site.  We entered the  shoulder using standard deltopectoral incision starting at the coracoid process.  Incision was taken down the anterior humerus.  Dissection down through subcutaneous tissues using Bovie.  We  identified the cephalic vein and took that laterally with the deltoid pectoralis taken medially.  Conjoined tendon identified and retracted medially.  We tenodesed the biceps in situ with figure-of-eight suture x2 into the pec tendon.  We then released  the subscapularis subperiosteally off the lesser tuberosity and tagged for repair at the end with #2 FiberWire suture in a modified Mason-Allen suture technique.  We released the inferior capsule progressively externally rotating the humerus.  Severe  arthritis was noted.  We then extended the shoulder and then entered the proximal humerus with a 6 mm reamer.  We reamed up to a size 8 and got cortical bite.  We then used the 8 mm intramedullary guide to resect the humeral head at 10 degrees of  retroversion with an oscillating saw, we removed excess osteophytes with a rongeur, irrigating thoroughly and then we reduced the shoulder and subluxed that posteriorly.  We gained good exposure of the glenoid face.  We released the capsule and removed  the capsule as well as the labrum.  We found extreme retroversion in the shoulder approximately 45 degrees at least and decided to go ahead and ream down the high side.  We used a rongeur initially on the high side.  We placed our center guide pin and  then protected the axillary nerve during the reaming process.  We reamed the high side down anteriorly to get to where we had the superior and inferior portions prepared.  There was still a little bit of unprepared posterior glenoid, but we felt like we  would have at least 70% of the glenoid baseplate supported.  We then drilled our central  peg hole.  We did our peripheral hand reaming.  We impacted the metaglene baseplate into position.  We were pleased with that correction of the  version and also the  inferior inclination.  We selected because of having to ream down the high side and medializing the glenoid.  We selected a 38+4 eccentric glenosphere and impacted that in position and screwed that down to secure it.  We irrigated and did a finger sweep  and made sure that no soft tissue was incarcerated in the bearing.  We then directed our attention back towards the humeral side.  We used our metaphyseal reamer for the size 1 left metaphysis and so we had the 8 stem with the 1 left set on 0 setting and  placed in 10 degrees of retroversion.  We had good support for that humeral implant.  We selected a 38+3 trial and then reduced the shoulder, had a nice little pop as it reduced.  It was not over tensioned and was quite stable.  We removed the trial  components from the humeral side, irrigated thoroughly.  We used available bone graft from the humeral head and impaction grafting technique with the Porocoat 8 stem HA coated 1 left metaphysis set on the 0 setting.  We impacted that in 10 degrees of  retroversion.  Prior to placing the humeral stem, we placed drill holes with a 1.6 mm drill bit in the lesser tuberosity and #2 FiberWire suture for repair of the subscap.  Once we had our stem in and sutures placed, we impacted a 38+3 standard poly  liner and then reduced the shoulder quite stable.  The axillary nerve checked and not under too much tension.  No gapping with external rotation or inferior pole.  We did an anatomic subscapularis repair back to the lesser tuberosity.  I irrigated  thoroughly again and closed deltopectoral interval with 0 Vicryl suture followed by 2-0 Vicryl for subcutaneous closure and 4-0 Monocryl for skin.  Steri-Strips applied followed by sterile dressing.  The patient tolerated surgery well.  TN/NUANCE  D:12/22/2018 T:12/22/2018 JOB:[id number]

## 2018-12-22 NOTE — Brief Op Note (Signed)
12/22/2018  9:51 AM  PATIENT:  [name]  68 y.o. female  PRE-OPERATIVE DIAGNOSIS:  Left shoulder cuff arthropathy  POST-OPERATIVE DIAGNOSIS:  Left shoulder cuff arthropathy  PROCEDURE:  Procedure(s) with comments: REVERSE SHOULDER ARTHROPLASTY (Left) - interscalene block DePuy Delta Xtend  SURGEON:  Surgeon(s) and Role:    Netta Cedars, MD - Primary  PHYSICIAN ASSISTANT:   ASSISTANTS: [place] Bruns, PA-C   ANESTHESIA:   regional and general  EBL:  100 mL   BLOOD ADMINISTERED:none  DRAINS: none   LOCAL MEDICATIONS USED:  MARCAINE     SPECIMEN:  No Specimen  DISPOSITION OF SPECIMEN:  N/A  COUNTS:  YES  TOURNIQUET:  * No tourniquets in log *  DICTATION: .Other Dictation: Dictation Number [id number]  PLAN OF CARE: Admit to inpatient   PATIENT DISPOSITION:  PACU - hemodynamically stable.   Delay start of Pharmacological VTE agent (>24hrs) due to surgical blood loss or risk of bleeding: no

## 2018-12-23 LAB — BASIC METABOLIC PANEL
Anion gap: 7 (ref 5–15)
BUN: 19 mg/dL (ref 8–23)
CO2: 28 mmol/L (ref 22–32)
Calcium: 8.8 mg/dL — ABNORMAL LOW (ref 8.9–10.3)
Chloride: 101 mmol/L (ref 98–111)
Creatinine, Ser: 0.9 mg/dL (ref 0.44–1.00)
GFR calc Af Amer: >60 mL/min (ref >60)
GFR calc non Af Amer: >60 mL/min (ref >60)
Glucose, Bld: 126 mg/dL — ABNORMAL HIGH (ref 70–99)
Potassium: 3.3 mmol/L — ABNORMAL LOW (ref 3.5–5.1)
Sodium: 136 mmol/L (ref 135–145)

## 2018-12-23 LAB — HEMOGLOBIN AND HEMATOCRIT, BLOOD
HCT: 28.8 % — ABNORMAL LOW (ref 36.0–46.0)
Hemoglobin: 9.4 g/dL — ABNORMAL LOW (ref 12.0–15.0)

## 2018-12-23 MED ORDER — FERROUS SULFATE 325 (65 FE) MG PO TBEC: 325.0000 mg | DELAYED_RELEASE_TABLET | Freq: Two times a day (BID) | ORAL | 0 refills | Status: DC

## 2018-12-23 NOTE — Discharge Summary (Signed)
Orthopedic Discharge Summary        Physician Discharge Summary  Patient ID: Summer Cooper MRN: [id number] DOB/AGE: October 16, 1950 68 y.o.  Admit date: 12/22/2018 Discharge date: 12/23/2018   Procedures:  Procedure(s) (LRB): REVERSE SHOULDER ARTHROPLASTY (Left)  Attending Physician:  Dr. Malon KindleSteven Alayasia Breeding  Admission Diagnoses:   Left shoulder end stage OA  Discharge Diagnoses:  same   Past Medical History:  Diagnosis Date  . Arthritis   . 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 Cataracts, bilateral    mild, no surgery yet  . Complication of anesthesia    hard to wake up (DURING TUBAL LIGATION.  OTHER SURGERIES WERE FINE)  . Headache    last flare up 04/06/2017  . Hearing loss    mild case - no aids used  . HTN (hypertension) 11/07/2018  . Seasonal allergies     PCP: Merri BrunetteSmith, Candace, MD   Discharged Condition: good  Hospital Course:  Patient underwent the above stated procedure on 12/22/2018. Patient tolerated the procedure well and brought to the recovery room in good condition and subsequently to the floor. Patient had an uncomplicated hospital course and was stable for discharge.   Disposition: Discharge disposition: 01-Home or Self Care      with follow up in 2 weeks   Follow-up Information    Beverely LowNorris, Lizmarie Witters, MD. Call in 2 weeks.   Specialty: Orthopedic Surgery Why: [phone] Contact information: [address] [place] [address] [phone]           Discharge Instructions    Call MD / Call 911   Complete by: As directed    If you experience chest pain or shortness of breath, CALL 911 and be transported to the hospital emergency room.  If you develope a fever above 101 F, pus (white drainage) or increased drainage or redness at the wound, or calf pain, call your surgeon's office.   Constipation Prevention   Complete by: As directed    Drink plenty of fluids.  Prune juice may be helpful.  You may use a stool softener, such  as Colace (over the counter) 100 mg twice a day.  Use MiraLax (over the counter) for constipation as needed.   Diet - low sodium heart healthy   Complete by: As directed    Driving restrictions   Complete by: As directed    No driving for 3 weeks   Increase activity slowly as tolerated   Complete by: As directed       Allergies as of 12/23/2018   No Known Allergies     Medication List    TAKE these medications   acetaminophen 500 MG tablet Commonly known as: TYLENOL Take 1,000 mg by mouth every 8 (eight) hours as needed for headache.   Calcium-Vitamin D 600-125 MG-UNIT Tabs Take 1 tablet by mouth daily.   cetirizine 10 MG tablet Commonly known as: ZYRTEC Take 10 mg by mouth daily as needed for allergies.   diclofenac 75 MG EC tablet Commonly known as: VOLTAREN Take 75 mg by mouth 2 (two) times daily.   ferrous sulfate 325 (65 FE) MG EC tablet Take 1 tablet (325 mg total) by mouth 2 (two) times daily.   fluticasone 50 MCG/ACT nasal spray Commonly known as: FLONASE Place 2 sprays into both nostrils daily as needed for allergies or rhinitis.   hydrochlorothiazide 12.5 MG capsule Commonly known as: MICROZIDE Take 12.5 mg by mouth daily.   HYDROcodone-acetaminophen 5-325  MG tablet Commonly known as: Norco Take 1 tablet by mouth every 6 (six) hours as needed for moderate pain or severe pain.   lisinopril 5 MG tablet Commonly known as: ZESTRIL Take 5 mg by mouth daily.   methocarbamol 500 MG tablet Commonly known as: Robaxin Take 1 tablet (500 mg total) by mouth every 8 (eight) hours as needed for muscle spasms.   SUMAtriptan 50 MG tablet Commonly known as: IMITREX Take 50 mg by mouth every 2 (two) hours as needed for migraine.   traMADol 50 MG tablet Commonly known as: ULTRAM Take 50 mg by mouth daily as needed for moderate pain.   Voltaren 1 % Gel Generic drug: diclofenac sodium Place 2 g onto the skin 4 (four) times daily as needed (pain).          Signed: Augustin Schooling 12/23/2018, 9:11 AM  [facility] is now [facility] [address] [place], [place], [place] [address] Phone: [place]

## 2018-12-23 NOTE — Plan of Care (Signed)
Pt dc home in stable condition. No needs at time of d/c. Pt sling in place at time of d/c. No questions on d/c instructions and education given.

## 2018-12-23 NOTE — Evaluation (Signed)
Occupational Therapy Evaluation Patient Details Name: Summer BenjaminCarolyn A Hy MRN: [id number] DOB: 10-Jun-1950 Today's Date: 12/23/2018    History of Present Illness REVERSE SHOULDER ARTHROPLASTY (Left)   Clinical Impression   OT education complete regarding ADL's as well as approved exercises ( elbow, wrist and hand)  Handout provided    Follow Up Recommendations  Follow surgeon's recommendation for DC plan and follow-up therapies    Equipment Recommendations  None recommended by OT       Precautions / Restrictions Precautions Precautions: Shoulder Shoulder Interventions: Shoulder sling/immobilizer Precaution Comments: ok for elbow, wrist and hand ROM , as well as hand to face      Mobility Bed Mobility Overal bed mobility: Modified Independent                Transfers Overall transfer level: Modified independent                          Pertinent Vitals/Pain Pain Assessment: 0-10 Pain Score: 2  Pain Location: l shoulder Pain Descriptors / Indicators: Sore Pain Intervention(s): Limited activity within patient's tolerance;Monitored during session              Cognition Arousal/Alertness: Awake/alert Behavior During Therapy: WFL for tasks assessed/performed Overall Cognitive Status: Within Functional Limits for tasks assessed                                           Shoulder Instructions Shoulder Instructions Donning/doffing shirt without moving shoulder: Min-guard;Patient able to independently direct caregiver Method for sponge bathing under operated UE: Minimal assistance;Patient able to independently direct caregiver Donning/doffing sling/immobilizer: Minimal assistance;Patient able to independently direct caregiver Correct positioning of sling/immobilizer: Supervision/safety;Patient able to independently direct caregiver ROM for elbow, wrist and digits of operated UE: Minimal assistance;Patient able to independently direct  caregiver Sling wearing schedule (on at all times/off for ADL's): Supervision/safety;Patient able to independently direct caregiver Proper positioning of operated UE when showering: Minimal assistance;Patient able to independently direct caregiver Positioning of UE while sleeping: Supervision/safety;Patient able to independently direct caregiver    Home Living Family/patient expects to be discharged to:: Private residence Living Arrangements: Spouse/significant other                                      Prior Functioning/Environment Level of Independence: Independent                          OT Goals(Current goals can be found in the care plan section) Acute Rehab OT Goals Patient Stated Goal: home this day OT Goal Formulation: With patient  OT Frequency:      AM-PAC OT "6 Clicks" Daily Activity     Outcome Measure Help from another person eating meals?: None Help from another person taking care of personal grooming?: None Help from another person toileting, which includes using toliet, bedpan, or urinal?: None Help from another person bathing (including washing, rinsing, drying)?: None Help from another person to put on and taking off regular upper body clothing?: A Little Help from another person to put on and taking off regular lower body clothing?: A Little 6 Click Score: 22   End of Session    Activity Tolerance: Patient tolerated treatment well;No increased pain Patient left: in  chair;with call bell/phone within reach;with family/visitor present                   Time: 1000-1020 OT Time Calculation (min): 20 min Charges:  OT General Charges $OT Visit: 1 Visit OT Evaluation $OT Eval Low Complexity: 1 Low  Kari Baars, OT Acute Rehabilitation Services [phone] Office- [phone], Edwena Felty D 12/23/2018, 12:43 PM

## 2018-12-23 NOTE — Progress Notes (Signed)
Orthopedics Progress Note  Subjective: Left shoulder with some pain but otherwise she feels well.  Objective:  Vitals:   12/23/18 0125 12/23/18 0500  BP: 114/68 113/69  Pulse: 86 93  Resp: 20 20  Temp: 98.6 F (37 C) 98.7 F (37.1 C)  SpO2: 95% 95%    General: Awake and alert  Musculoskeletal: Left shoulder dressing changed, wound looks good. No active drainage  Neurovascularly intact  Lab Results  Component Value Date   WBC 7.0 12/19/2018   HGB 9.4 (L) 12/23/2018   HCT 28.8 (L) 12/23/2018   MCV 97.4 12/19/2018   PLT 303 12/19/2018       Component Value Date/Time   NA 136 12/23/2018 0251   K 3.3 (L) 12/23/2018 0251   CL 101 12/23/2018 0251   CO2 28 12/23/2018 0251   GLUCOSE 126 (H) 12/23/2018 0251   BUN 19 12/23/2018 0251   CREATININE 0.90 12/23/2018 0251   CALCIUM 8.8 (L) 12/23/2018 0251   GFRNONAA >60 12/23/2018 0251   GFRAA >60 12/23/2018 0251    No results found for: INR, PROTIME  Assessment/Plan: POD #1 s/p Procedure(s): REVERSE SHOULDER ARTHROPLASTY Occupational therapy this morning and then discharge later today  Remo Lipps R. Veverly Fells, MD 12/23/2018 9:07 AM

## 2018-12-25 ENCOUNTER — Encounter (HOSPITAL_COMMUNITY): Payer: Self-pay | Admitting: Orthopedic Surgery

## 2019-06-15 ENCOUNTER — Other Ambulatory Visit: Payer: Self-pay

## 2019-06-15 ENCOUNTER — Ambulatory Visit: Payer: Medicare Other | Admitting: Cardiology

## 2019-06-15 ENCOUNTER — Encounter: Payer: Self-pay | Admitting: Cardiology

## 2019-06-15 VITALS — BP 115/60 | HR 76 | Temp 98.0°F | Resp 16 | Ht 60.0 in | Wt 124.0 lb

## 2019-06-15 DIAGNOSIS — I351 Nonrheumatic aortic (valve) insufficiency: Secondary | ICD-10-CM

## 2019-06-15 DIAGNOSIS — R9431 Abnormal electrocardiogram [ECG] [EKG]: Secondary | ICD-10-CM

## 2019-06-15 DIAGNOSIS — R0609 Other forms of dyspnea: Secondary | ICD-10-CM

## 2019-06-15 DIAGNOSIS — I10 Essential (primary) hypertension: Secondary | ICD-10-CM

## 2019-06-15 NOTE — Progress Notes (Signed)
Primary Physician:  Carol Ada, MD   Patient ID: Summer Cooper, female    DOB: 07/08/1950, 69 y.o.   MRN: [id number]  Subjective:    Chief Complaint  Patient presents with  . Hypertension    6 month follow up    HPI: Summer Cooper  is a 69 y.o. female  With hypertension, history of migraine headaches, osteoarthritis, originally evaluated by [place] for perioperative cardiac risk stratification for upcoming left shoulder arthroplasty with Dr. Veverly Fells and for abnormal EKG. Found to have moderate AR, normal LVEF by echo in August 2020. She did not undergo stress testing in view of good functional capacity. She underwent surgery in August 2725 without complications. She now presents for 6 month follow up.  Patient states that she is overall doing well.  She has noticed of having some shortness of breath with walking uphill.  No exertional chest pain.  Reports that her lipids are borderline, but not on therapy as her HDL is good. No history of diabetes or thyroid disorders.  She does not exercise, but is fairly active. She does climb stairs at home that she tolerates well. Also does yard work and states that she is always busy doing something around her house. Has chronic leg swelling when on her feet at the end of the day.   No former tobacco use. She drinks 5 glasses of wine a week. No illicit drug use. 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   Past Medical History:  Diagnosis Date  . Arthritis   . 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 Cataracts, bilateral    mild, no surgery yet  . Complication of anesthesia    hard to wake up (DURING TUBAL LIGATION.  OTHER SURGERIES WERE FINE)  . Headache    last flare up 04/06/2017  . Hearing loss    mild case - no aids used  . HTN (hypertension) 11/07/2018  . Seasonal allergies     Past Surgical History:  Procedure Laterality Date  . BUNIONECTOMY  2011  . COLONOSCOPY    . I & D  EXTREMITY     cat bite-rt wrist  . JOINT REPLACEMENT    . REVERSE SHOULDER ARTHROPLASTY Left 12/22/2018   Procedure: REVERSE SHOULDER ARTHROPLASTY;  Surgeon: Netta Cedars, MD;  Location: [facility];  Service: Orthopedics;  Laterality: Left;  interscalene block  . TARSAL METATARSAL ARTHRODESIS  05/13/2011   Procedure: TARSAL METATARSAL FUSION;  Surgeon: Wylene Simmer, MD;  Location: [facility];  Service: Orthopedics;  Laterality: Right;  right Lapidus 1st tarsal metatarsal arthrodesis, right 2nd hammer toe correction, mcbride bunionectomy, 2nd metatarsal weil osteotomy  . TONSILLECTOMY    . TOTAL SHOULDER ARTHROPLASTY Right 05/27/2017   Procedure: RIGHT TOTAL SHOULDER ARTHROPLASTY;  Surgeon: Netta Cedars, MD;  Location: [place];  Service: Orthopedics;  Laterality: Right;  . TUBAL LIGATION      Social History   Socioeconomic History  . Marital status: Married    Spouse name: Not on file  . Number of children: 1  . Years of education: Not on file  . Highest education level: Not on file  Occupational History  . Not on file  Tobacco Use  . Smoking status: Never Smoker  . Smokeless tobacco: Never Used  Substance and Sexual Activity  . Alcohol use: Yes    Alcohol/week: 3.0 - 4.0 standard drinks    Types: 3 - 4  Glasses of wine per week    Comment: occ  . Drug use: No  . Sexual activity: Not on file  Other Topics Concern  . Not on file  Social History Narrative  . Not on file   Social Determinants of Health   Financial Resource Strain:   . Difficulty of Paying Living Expenses: Not on file  Food Insecurity:   . Worried About Charity fundraiser in the Last Year: Not on file  . Ran Out of Food in the Last Year: Not on file  Transportation Needs:   . Lack of Transportation (Medical): Not on file  . Lack of Transportation (Non-Medical): Not on file  Physical Activity:   . Days of Exercise per Week: Not on file  . Minutes of Exercise per Session: Not on file  Stress:   .  Feeling of Stress : Not on file  Social Connections:   . Frequency of Communication with Friends and Family: Not on file  . Frequency of Social Gatherings with Friends and Family: Not on file  . Attends Religious Services: Not on file  . Active Member of Clubs or Organizations: Not on file  . Attends Archivist Meetings: Not on file  . Marital Status: Not on file  Intimate Partner Violence:   . Fear of Current or Ex-Partner: Not on file  . Emotionally Abused: Not on file  . Physically Abused: Not on file  . Sexually Abused: Not on file    Review of Systems  Constitution: Negative for decreased appetite, malaise/fatigue, weight gain and weight loss.  Eyes: Negative for visual disturbance.  Cardiovascular: Positive for dyspnea on exertion. Negative for chest pain, claudication, leg swelling, orthopnea, palpitations and syncope.  Respiratory: Negative for hemoptysis and wheezing.   Endocrine: Negative for cold intolerance and heat intolerance.  Hematologic/Lymphatic: Does not bruise/bleed easily.  Skin: Negative for nail changes.  Musculoskeletal: Negative for muscle weakness and myalgias.  Gastrointestinal: Negative for abdominal pain, change in bowel habit, nausea and vomiting.  Neurological: Negative for difficulty with concentration, dizziness, focal weakness and headaches.  Psychiatric/Behavioral: Negative for altered mental status and suicidal ideas.  All other systems reviewed and are negative.     Objective:  Blood pressure 115/60, pulse 76, temperature 98 F (36.7 C), temperature source Temporal, resp. rate 16, height 5' (1.524 m), weight 124 lb (56.2 kg), SpO2 99 %. Body mass index is 24.22 kg/m.    Physical Exam  Constitutional: She is oriented to person, place, and time. Vital signs are normal. She appears well-developed and well-nourished.  HENT:  Head: Normocephalic and atraumatic.  Cardiovascular: Normal rate, regular rhythm and intact distal pulses.    Murmur heard. High-pitched blowing decrescendo early diastolic murmur is present with a grade of 2/6 at the upper right sternal border radiating to the apex. Pulmonary/Chest: Effort normal and breath sounds normal. No accessory muscle usage. No respiratory distress.  Abdominal: Soft. Bowel sounds are normal.  Musculoskeletal:        General: Normal range of motion.     Cervical back: Normal range of motion.  Neurological: She is alert and oriented to person, place, and time.  Skin: Skin is warm and dry.  Vitals reviewed.  Radiology: No results found.  Laboratory examination:   PCP labs 05/29/2018: RBC 3.92, normal H&H, CBC otherwise normal.  Creatinine 0.92, EGFR 60/73, potassium 4.1, CMP normal.  TSH 2.68.  Cholesterol 253, triglycerides 134, HDL 105, LDL 121.  CMP Latest Ref Rng & Units  12/23/2018 12/19/2018 05/28/2017  Glucose 70 - 99 mg/dL 126(H) 111(H) 116(H)  BUN 8 - 23 mg/dL 19 45(H) 24(H)  Creatinine 0.44 - 1.00 mg/dL 0.90 1.36(H) 1.10(H)  Sodium 135 - 145 mmol/L 136 136 135  Potassium 3.5 - 5.1 mmol/L 3.3(L) 4.5 2.9(L)  Chloride 98 - 111 mmol/L 101 100 99(L)  CO2 22 - 32 mmol/L _0 Calcium 8.9 - 10.3 mg/dL 8.8(L) 9.6 8.3(L)   CBC Latest Ref Rng & Units 12/23/2018 12/19/2018 05/28/2017  WBC 4.0 - 10.5 K/uL - 7.0 -  Hemoglobin 12.0 - 15.0 g/dL 9.4(L) 12.5 9.8(L)  Hematocrit 36.0 - 46.0 % 28.8(L) 37.8 28.9(L)  Platelets 150 - 400 K/uL - 303 -   Lipid Panel  No results found for: CHOL, TRIG, HDL, CHOLHDL, VLDL, LDLCALC, LDLDIRECT HEMOGLOBIN A1C No results found for: HGBA1C, MPG TSH No results for input(s): TSH in the last 8760 hours.  PRN Meds:. Medications Discontinued During This Encounter  Medication Reason  . methocarbamol (ROBAXIN) 500 MG tablet Error   Current Meds  Medication Sig  . acetaminophen (TYLENOL) 500 MG tablet Take 1,000 mg by mouth every 8 (eight) hours as needed for headache.  . Calcium Carbonate-Vitamin D (CALCIUM-VITAMIN D) 600-125 MG-UNIT TABS  Take 1 tablet by mouth daily.  . cetirizine (ZYRTEC) 10 MG tablet Take 10 mg by mouth daily as needed for allergies.  Marland Kitchen diclofenac (VOLTAREN) 75 MG EC tablet Take 75 mg by mouth 2 (two) times daily.  . fluticasone (FLONASE) 50 MCG/ACT nasal spray Place 2 sprays into both nostrils daily as needed for allergies or rhinitis.  . hydrochlorothiazide (MICROZIDE) 12.5 MG capsule Take 12.5 mg by mouth daily.  Marland Kitchen HYDROcodone-acetaminophen (NORCO) 5-325 MG tablet Take 1 tablet by mouth every 6 (six) hours as needed for moderate pain or severe pain.  Marland Kitchen lisinopril (PRINIVIL,ZESTRIL) 5 MG tablet Take 5 mg by mouth daily.  . SUMAtriptan (IMITREX) 50 MG tablet Take 50 mg by mouth every 2 (two) hours as needed for migraine.   . traMADol (ULTRAM) 50 MG tablet Take 50 mg by mouth daily as needed for moderate pain.    Cardiac Studies:   Echocardiogram 12/12/2018: Left ventricle cavity is normal in size. Normal left ventricular wall thickness. Normal LV systolic function with EF 67%. Normal global wall motion. Indeterminate diastolic filling pattern.  Left atrial cavity is normal in size. 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 Trileaflet aortic valve. Moderate (Grade III) aortic regurgitation. Mild tricuspid regurgitation. Estimated pulmonary artery systolic pressure is 25 mmHg.  Assessment:     ICD-10-CM   1. Essential hypertension  I10 EKG 12-Lead     EKG 06/15/2019: Normal sinus rhythm at 72 bpm, normal axis, PRWP, cannot exclude anterior infarct old. No evidence of ischemia. Previously noted T wave inversion in V3 on PCP EKG, not noted. NO changes from EKG 11/09/2018  PCP EKG 11/07/2018: Normal sinus rhythm at 83 bpm, normal axis, poor R wave progression cannot exclude anterior infarct old.  T wave inversion in V3, cannot exclude anterior ischemia.  Abnormal EKG.  Recommendations:   She is here for 66-monthoffice visit.  She has noticed over the last few  months dyspnea particularly with overexertion or walking uphill.  No chest pain.  She does have abnormal EKG that is unchanged compared to her previous EKG.  Given her new onset symptoms, recommended further evaluation with Lexiscan nuclear stress test.  She does have chronic back pain from arthritis, therefore  unable to perform treadmill stress test and also immediately.    Blood pressure is well controlled.  No clinical evidence of heart failure.  She does have moderate aortic regurgitation that was previously noted on echocardiogram in August 2020.  I recommended repeating for continued surveillance of this in view of her new onset dyspnea on exertion.  She is planning to meet with her PCP next week. She does have slightly elevated LDL; however, HDL is also elevated. Would recommend repeating Lipids for continued surveillance. Encouraged her to work on recent 8 lb weight gain with dietary changes.  We will plan to see her back in 4 weeks after her stress test cardiogram.  Miquel Dunn, MSN, APRN, FNP-C [facility]. [place] Office: [phone] Fax: [phone]

## 2019-06-17 ENCOUNTER — Encounter: Payer: Self-pay | Admitting: Cardiology

## 2019-06-27 ENCOUNTER — Ambulatory Visit: Payer: Medicare Other

## 2019-06-27 ENCOUNTER — Other Ambulatory Visit: Payer: Self-pay

## 2019-06-27 DIAGNOSIS — I351 Nonrheumatic aortic (valve) insufficiency: Secondary | ICD-10-CM

## 2019-07-02 ENCOUNTER — Ambulatory Visit: Payer: Medicare Other

## 2019-07-02 ENCOUNTER — Other Ambulatory Visit: Payer: Self-pay

## 2019-07-02 DIAGNOSIS — R0609 Other forms of dyspnea: Secondary | ICD-10-CM

## 2019-07-02 DIAGNOSIS — R9431 Abnormal electrocardiogram [ECG] [EKG]: Secondary | ICD-10-CM

## 2019-07-13 ENCOUNTER — Ambulatory Visit: Payer: Medicare Other | Admitting: Cardiology

## 2019-07-13 ENCOUNTER — Other Ambulatory Visit: Payer: Self-pay

## 2019-07-13 ENCOUNTER — Encounter: Payer: Self-pay | Admitting: Cardiology

## 2019-07-13 VITALS — BP 128/59 | HR 73 | Temp 98.0°F | Ht 60.0 in | Wt 125.0 lb

## 2019-07-13 DIAGNOSIS — Z712 Person consulting for explanation of examination or test findings: Secondary | ICD-10-CM

## 2019-07-13 DIAGNOSIS — I351 Nonrheumatic aortic (valve) insufficiency: Secondary | ICD-10-CM

## 2019-07-13 DIAGNOSIS — R0609 Other forms of dyspnea: Secondary | ICD-10-CM

## 2019-07-13 DIAGNOSIS — I10 Essential (primary) hypertension: Secondary | ICD-10-CM

## 2019-07-13 NOTE — Progress Notes (Signed)
Primary Physician:  Carol Ada, MD   Patient ID: Summer Cooper, female    DOB: 07-20-1950, 69 y.o.   MRN: [id number]  Subjective:    Chief Complaint  Patient presents with  . Hypertension  . aortic regurgitation  . Follow-up    testing    HPI: Summer Cooper  is a 69 y.o. female who presents to the office with a chief complaint of " review test results." Her past medical history and cardiovascular risk factors include: hypertension, history of migraine headaches, osteoarthritis, moderate AR, postmenopausal female, advanced age.    She was last seen in the office in February 2021 by Binnie Kand.  During that visit patient mentioned that she was getting more short of breath with effort related activities such as going up a hill.  Given her underlying moderate aortic regurgitation, EKG findings, and cardiovascular risk factors patient also was requested to undergo a stress test.  Both the echocardiogram and nuclear stress test were performed in the interim and the results were reviewed with the patient today in great detail.  Findings noted below for further reference.  Patient also went to see her primary care physician for her yearly physical and had blood work done her most recent lipid profile is reviewed and noted below for further reference.  Patient states that her BP at home ranges between 110-120/70s and pulse 80bpm.   She does not exercise, but is fairly active. She does climb stairs at home that she tolerates well. Also does yard work and states that she is always busy doing something around her house. Has chronic leg swelling when on her feet at the end of the day.   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   Past Medical History:  Diagnosis Date  . Arthritis   . 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 Cataracts, bilateral    mild, no surgery yet  . Complication of anesthesia    hard to wake up (DURING TUBAL  LIGATION.  OTHER SURGERIES WERE FINE)  . Headache    last flare up 04/06/2017  . Hearing loss    mild case - no aids used  . HTN (hypertension) 11/07/2018  . Seasonal allergies     Past Surgical History:  Procedure Laterality Date  . BUNIONECTOMY  2011  . COLONOSCOPY    . I & D EXTREMITY     cat bite-rt wrist  . JOINT REPLACEMENT    . REVERSE SHOULDER ARTHROPLASTY Left 12/22/2018   Procedure: REVERSE SHOULDER ARTHROPLASTY;  Surgeon: Netta Cedars, MD;  Location: [facility];  Service: Orthopedics;  Laterality: Left;  interscalene block  . TARSAL METATARSAL ARTHRODESIS  05/13/2011   Procedure: TARSAL METATARSAL FUSION;  Surgeon: Wylene Simmer, MD;  Location: [place];  Service: Orthopedics;  Laterality: Right;  right Lapidus 1st tarsal metatarsal arthrodesis, right 2nd hammer toe correction, mcbride bunionectomy, 2nd metatarsal weil osteotomy  . TONSILLECTOMY    . TOTAL SHOULDER ARTHROPLASTY Right 05/27/2017   Procedure: RIGHT TOTAL SHOULDER ARTHROPLASTY;  Surgeon: Netta Cedars, MD;  Location: [place];  Service: Orthopedics;  Laterality: Right;  . TUBAL LIGATION      Social History   Socioeconomic History  . Marital status: Married    Spouse name: Not on file  . Number of children: 1  . Years of education: Not on file  . Highest education level: Not on file  Occupational History  .  Not on file  Tobacco Use  . Smoking status: Never Smoker  . Smokeless tobacco: Never Used  Substance and Sexual Activity  . Alcohol use: Yes    Alcohol/week: 3.0 - 4.0 standard drinks    Types: 3 - 4 Glasses of wine per week    Comment: occ  . Drug use: No  . Sexual activity: Not on file  Other Topics Concern  . Not on file  Social History Narrative  . Not on file   Social Determinants of Health   Financial Resource Strain:   . Difficulty of Paying Living Expenses:   Food Insecurity:   . Worried About Charity fundraiser in the Last Year:   . Arboriculturist in the Last Year:    Transportation Needs:   . Film/video editor (Medical):   Marland Kitchen Lack of Transportation (Non-Medical):   Physical Activity:   . Days of Exercise per Week:   . Minutes of Exercise per Session:   Stress:   . Feeling of Stress :   Social Connections:   . Frequency of Communication with Friends and Family:   . Frequency of Social Gatherings with Friends and Family:   . Attends Religious Services:   . Active Member of Clubs or Organizations:   . Attends Archivist Meetings:   Marland Kitchen Marital Status:   Intimate Partner Violence:   . Fear of Current or Ex-Partner:   . Emotionally Abused:   Marland Kitchen Physically Abused:   . Sexually Abused:     Review of Systems  Constitution: Negative for decreased appetite, malaise/fatigue, weight gain and weight loss.  Eyes: Negative for visual disturbance.  Cardiovascular: Negative for chest pain, claudication, dyspnea on exertion, leg swelling, orthopnea, palpitations and syncope.  Respiratory: Negative for hemoptysis and wheezing.   Endocrine: Negative for cold intolerance and heat intolerance.  Hematologic/Lymphatic: Does not bruise/bleed easily.  Skin: Negative for nail changes.  Musculoskeletal: Negative for muscle weakness and myalgias.  Gastrointestinal: Negative for abdominal pain, change in bowel habit, nausea and vomiting.  Neurological: Negative for difficulty with concentration, dizziness, focal weakness and headaches.  Psychiatric/Behavioral: Negative for altered mental status and suicidal ideas.  All other systems reviewed and are negative.     Objective:  Blood pressure (!) 128/59, pulse 73, temperature 98 F (36.7 C), height 5' (1.524 m), weight 125 lb (56.7 kg), SpO2 98 %. Body mass index is 24.41 kg/m.    Physical Exam  Constitutional: She is oriented to person, place, and time. Vital signs are normal. She appears well-developed and well-nourished.  HENT:  Head: Normocephalic and atraumatic.  Cardiovascular: Normal rate,  regular rhythm and intact distal pulses.  Murmur heard. High-pitched blowing decrescendo early diastolic murmur is present with a grade of 2/6 at the upper right sternal border radiating to the apex. Pulmonary/Chest: Effort normal and breath sounds normal. No accessory muscle usage. No respiratory distress.  Abdominal: Soft. Bowel sounds are normal.  Musculoskeletal:        General: Normal range of motion.     Cervical back: Normal range of motion.  Neurological: She is alert and oriented to person, place, and time.  Skin: Skin is warm and dry.  Vitals reviewed.  Laboratory examination:  External Labs: 05/29/2018: RBC 3.92, normal H&H, CBC otherwise normal.  Creatinine 0.92, EGFR 60/73, potassium 4.1, CMP normal.  TSH 2.68.  Cholesterol 253, triglycerides 134, HDL 105, LDL 121.  Collected: June 26, 2019 Creatinine 0.88 mg/dL. Lipid profile: Total cholesterol 226,  triglycerides 70, HDL 101, LDL 113 TSH: 2.48   CMP Latest Ref Rng & Units 12/23/2018 12/19/2018 05/28/2017  Glucose 70 - 99 mg/dL 126(H) 111(H) 116(H)  BUN 8 - 23 mg/dL 19 45(H) 24(H)  Creatinine 0.44 - 1.00 mg/dL 0.90 1.36(H) 1.10(H)  Sodium 135 - 145 mmol/L 136 136 135  Potassium 3.5 - 5.1 mmol/L 3.3(L) 4.5 2.9(L)  Chloride 98 - 111 mmol/L 101 100 99(L)  CO2 22 - 32 mmol/L 28 25 25   Calcium 8.9 - 10.3 mg/dL 8.8(L) 9.6 8.3(L)   CBC Latest Ref Rng & Units 12/23/2018 12/19/2018 05/28/2017  WBC 4.0 - 10.5 K/uL - 7.0 -  Hemoglobin 12.0 - 15.0 g/dL 9.4(L) 12.5 9.8(L)  Hematocrit 36.0 - 46.0 % 28.8(L) 37.8 28.9(L)  Platelets 150 - 400 K/uL - 303 -   Cardiac Studies:   EKG: @PCP  11/07/2018: Normal sinus rhythm at 83 bpm, normal axis, poor R wave progression cannot exclude anterior infarct old.  T wave inversion in V3, cannot exclude anterior ischemia.  Abnormal EKG. 06/15/2019: Normal sinus rhythm at 72 bpm, normal axis, PRWP, cannot exclude anterior infarct old. No evidence of ischemia. Previously noted T wave inversion in V3 on PCP  EKG, not noted. NO changes from EKG 11/09/2018  Echocardiogram: 12/12/2018: Left ventricle cavity is normal in size. Normal left ventricular wall thickness. Normal LV systolic function with EF 67%. Normal global wall motion. Indeterminate diastolic filling pattern. Left atrial cavity is normal in size. 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 Trileaflet aortic valve. Moderate (Grade III) aortic regurgitation. Mild tricuspid regurgitation. Estimated pulmonary artery systolic pressure is 25 mmHg.  06/27/2019: LVEF 97%, grade 1 diastolic impairment, mildly dilated proximal ascending aorta at 3.7 cm, moderate AR, mild TR, RVSP 30 mmHg, right atrial pressure estimated to be 10-15 mmHg.  Stress test: 07/04/2019: Myocardial perfusion is normal, LVEF by gated SPECT 55%.  Low risk study  Assessment:     ICD-10-CM   1. Essential hypertension  I10   2. Moderate aortic regurgitation  I35.1 PCV ECHOCARDIOGRAM COMPLETE  3. Dyspnea on exertion  R06.00   4. Encounter to discuss test results  Z71.2      Recommendations:   AERILYNN GOIN is a 69 y.o. female whose past medical history and cardiac risk factors include: hypertension, history of migraine headaches, osteoarthritis, moderate AR, postmenopausal female, advanced age.    Aortic regurgitation:  Patient severity of aortic regurgitation remains stable compared to a prior echo from 2020.  At times he does have shortness of breath but this is chronic and stable and is not progressive in nature.  Patient is encouraged to continue with moderate intensity physical exercise 30 minutes a day 5 days a week.  If she were to have worsening symptoms of shortness of breath, tired, fatigue she needs to be seen sooner than the scheduled follow-up appointment.  Patient blood pressure is well controlled on current antihypertensive medications.  We will order a 1 year follow-up echo to reevaluate aortic regurgitation  progression.  Benign essential hypertension: . Office blood pressure is at goal.  . Medication reconciled.  . Patient is asked to keep a log of both blood pressure and pulse so that medications can be titrated based on a blood pressure trend as opposed to isolated blood pressure readings in the office. . If the blood pressure is consistently greater than 117mHg patient is asked to call the office to for medication titration sooner than the next office visit.  .Marland Kitchen  Low salt diet recommended. A diet that is rich in fruits, vegetables, legumes, and low-fat dairy products and low in snacks, sweets, and meats (such as the Dietary Approaches to Stop Hypertension [DASH] diet).   Patient's estimated 10-year risk of ASCVD is approximately 11.7%.  Educated on importance of lifestyle modifications and moderate intensity statin therapy.  However, patient would like to proceed with lifestyle modification at this time.  We will continue to monitor.  Today's office visit we did discuss the results of both the echo and a stress test.  Findings are noted above for further reference.  Orders Placed This Encounter  Procedures  . PCV ECHOCARDIOGRAM COMPLETE   --Continue cardiac medications as reconciled in final medication list. --Return in about 1 year (around 07/12/2020) for after echo . Or sooner if needed. --Continue follow-up with your primary care physician regarding the management of your other chronic comorbid conditions.  Patient's questions and concerns were addressed to her satisfaction. She voices understanding of the instructions provided during this encounter.   This note was created using a voice recognition software as a result there may be grammatical errors inadvertently enclosed that do not reflect the nature of this encounter. Every attempt is made to correct such errors.  Rex Kras, DO, [facility]. [place] Office: [phone]

## 2020-05-26 ENCOUNTER — Other Ambulatory Visit: Payer: Self-pay

## 2020-05-26 ENCOUNTER — Ambulatory Visit: Payer: Medicare Other

## 2020-05-26 DIAGNOSIS — I351 Nonrheumatic aortic (valve) insufficiency: Secondary | ICD-10-CM

## 2020-05-30 ENCOUNTER — Other Ambulatory Visit: Payer: Medicare Other

## 2020-07-14 ENCOUNTER — Ambulatory Visit: Payer: Medicare Other | Admitting: Cardiology

## 2020-07-14 ENCOUNTER — Encounter: Payer: Self-pay | Admitting: Cardiology

## 2020-07-14 ENCOUNTER — Other Ambulatory Visit: Payer: Self-pay

## 2020-07-14 VITALS — BP 123/61 | HR 69 | Temp 97.8°F | Resp 16 | Ht 60.0 in | Wt 125.0 lb

## 2020-07-14 DIAGNOSIS — I10 Essential (primary) hypertension: Secondary | ICD-10-CM

## 2020-07-14 DIAGNOSIS — I351 Nonrheumatic aortic (valve) insufficiency: Secondary | ICD-10-CM

## 2020-07-14 DIAGNOSIS — R0609 Other forms of dyspnea: Secondary | ICD-10-CM

## 2020-07-14 DIAGNOSIS — R0600 Dyspnea, unspecified: Secondary | ICD-10-CM

## 2020-07-14 NOTE — Progress Notes (Signed)
[name] Date of Birth: December 11, 1950 MRN: [id number] Primary Care Provider:Smith, Hal Hope, MD Former Cardiology Providers: Jeri Lager, APRN, FNP-C Primary Cardiologist: Rex Kras, DO, [facility] (established care 07/13/2022)   Date: 07/14/20 Last Office Visit: 07/13/2019  Chief Complaint  Patient presents with  . Moderate aortic regurgitation  . Follow-up    1 year  . Results    echo   Subjective:   HPI: Summer Cooper  is a 70 y.o. female who presents to the office with a chief complaint of " 1 year follow-up for management of aortic regurgitation, review echo." Her past medical history and cardiovascular risk factors include: hypertension, history of migraine headaches, osteoarthritis, moderate AR, postmenopausal female, advanced age.    Patient is being seen in the office for management of her aortic regurgitation.  She has been an established patient since February 2021 when she was referred to the office for evaluation of shortness of breath with effort related activities.  At the last office visit the shared decision was to proceed with 1 year follow-up and echo prior to evaluate the progression of aortic regurgitation.  She was also encouraged to see [place] sooner if her symptoms of effort related dyspnea resurfaces.  Over the last 1 year patient states that she is doing well from a cardiovascular standpoint.  No hospitalizations or urgent care visits due to cardiovascular symptoms.  Patient denies any angina pectoralis and her dyspnea on exertion is overall stable.  She has not noticed her dyspnea to get worse in intensity, frequency, and/or duration.  No significant change in her physical endurance.  Her blood pressure at today's office visit within acceptable range.  She recently followed up with her PCP and had her annual labs done as well.  I do not have a copy during today's encounter to review them.  She does not exercise, but is fairly active. She does climb stairs at  home that she tolerates well. Also does yard work and states that she is always busy doing something around her house. Has chronic leg swelling when on her feet at the end of the day.   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   Past Medical History:  Diagnosis Date  . Arthritis   . 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 Cataracts, bilateral    mild, no surgery yet  . Complication of anesthesia    hard to wake up (DURING TUBAL LIGATION.  OTHER SURGERIES WERE FINE)  . Headache    last flare up 04/06/2017  . Hearing loss    mild case - no aids used  . HTN (hypertension) 11/07/2018  . Seasonal allergies     Past Surgical History:  Procedure Laterality Date  . BUNIONECTOMY  2011  . COLONOSCOPY    . I & D EXTREMITY     cat bite-rt wrist  . JOINT REPLACEMENT    . REVERSE SHOULDER ARTHROPLASTY Left 12/22/2018   Procedure: REVERSE SHOULDER ARTHROPLASTY;  Surgeon: Netta Cedars, MD;  Location: [facility];  Service: Orthopedics;  Laterality: Left;  interscalene block  . TARSAL METATARSAL ARTHRODESIS  05/13/2011   Procedure: TARSAL METATARSAL FUSION;  Surgeon: Wylene Simmer, MD;  Location: [facility];  Service: Orthopedics;  Laterality: Right;  right Lapidus 1st tarsal metatarsal arthrodesis, right 2nd hammer toe correction, mcbride bunionectomy, 2nd metatarsal weil osteotomy  . TONSILLECTOMY    . TOTAL SHOULDER ARTHROPLASTY Right 05/27/2017  Procedure: RIGHT TOTAL SHOULDER ARTHROPLASTY;  Surgeon: Netta Cedars, MD;  Location: [facility];  Service: Orthopedics;  Laterality: Right;  . TUBAL LIGATION      Social History   Socioeconomic History  . Marital status: Married    Spouse name: Not on file  . Number of children: 1  . Years of education: Not on file  . Highest education level: Not on file  Occupational History  . Not on file  Tobacco Use  . Smoking status: Never Smoker  . Smokeless tobacco: Never Used  Vaping Use  .  Vaping Use: Never used  Substance and Sexual Activity  . Alcohol use: Yes    Alcohol/week: 3.0 - 4.0 standard drinks    Types: 3 - 4 Glasses of wine per week    Comment: occ  . Drug use: No  . Sexual activity: Not on file  Other Topics Concern  . Not on file  Social History Narrative  . Not on file   Social Determinants of Health   Financial Resource Strain: Not on file  Food Insecurity: Not on file  Transportation Needs: Not on file  Physical Activity: Not on file  Stress: Not on file  Social Connections: Not on file  Intimate Partner Violence: Not on file    Review of Systems  Constitutional: Negative for decreased appetite, malaise/fatigue, weight gain and weight loss.  Eyes: Negative for visual disturbance.  Cardiovascular: Negative for chest pain, claudication, dyspnea on exertion, leg swelling, orthopnea, palpitations and syncope.  Respiratory: Negative for hemoptysis and wheezing.   Endocrine: Negative for cold intolerance and heat intolerance.  Hematologic/Lymphatic: Does not bruise/bleed easily.  Skin: Negative for nail changes.  Musculoskeletal: Negative for muscle weakness and myalgias.  Gastrointestinal: Negative for abdominal pain, change in bowel habit, nausea and vomiting.  Neurological: Negative for difficulty with concentration, dizziness, focal weakness and headaches.  Psychiatric/Behavioral: Negative for altered mental status and suicidal ideas.  All other systems reviewed and are negative.     Objective:  Blood pressure 123/61, pulse 69, temperature 97.8 F (36.6 C), resp. rate 16, height 5' (1.524 m), weight 125 lb (56.7 kg), SpO2 98 %. Body mass index is 24.41 kg/m.    Physical Exam Vitals reviewed.  Constitutional:      Appearance: She is well-developed.  HENT:     Head: Normocephalic and atraumatic.  Cardiovascular:     Rate and Rhythm: Normal rate and regular rhythm.     Pulses: Intact distal pulses.     Heart sounds: Murmur heard.   High-pitched blowing decrescendo early diastolic murmur is present with a grade of 2/4 at the upper right sternal border radiating to the apex.   Pulmonary:     Effort: Pulmonary effort is normal. No accessory muscle usage or respiratory distress.     Breath sounds: Normal breath sounds.  Abdominal:     General: Bowel sounds are normal.     Palpations: Abdomen is soft.  Musculoskeletal:        General: Normal range of motion.     Cervical back: Normal range of motion.  Skin:    General: Skin is warm and dry.  Neurological:     Mental Status: She is alert and oriented to person, place, and time.    Laboratory examination:  External Labs: 05/29/2018: RBC 3.92, normal H&H, CBC otherwise normal.  Creatinine 0.92, EGFR 60/73, potassium 4.1, CMP normal.  TSH 2.68.  Cholesterol 253, triglycerides 134, HDL 105, LDL 121.  Collected: June 26, 2019 Creatinine 0.88 mg/dL. Lipid profile: Total cholesterol 226, triglycerides 70, HDL 101, LDL 113 TSH: 2.48   CMP Latest Ref Rng & Units 12/23/2018 12/19/2018 05/28/2017  Glucose 70 - 99 mg/dL 126(H) 111(H) 116(H)  BUN 8 - 23 mg/dL 19 45(H) 24(H)  Creatinine 0.44 - 1.00 mg/dL 0.90 1.36(H) 1.10(H)  Sodium 135 - 145 mmol/L 136 136 135  Potassium 3.5 - 5.1 mmol/L 3.3(L) 4.5 2.9(L)  Chloride 98 - 111 mmol/L 101 100 99(L)  CO2 22 - 32 mmol/L 28 25 25   Calcium 8.9 - 10.3 mg/dL 8.8(L) 9.6 8.3(L)   CBC Latest Ref Rng & Units 12/23/2018 12/19/2018 05/28/2017  WBC 4.0 - 10.5 K/uL - 7.0 -  Hemoglobin 12.0 - 15.0 g/dL 9.4(L) 12.5 9.8(L)  Hematocrit 36.0 - 46.0 % 28.8(L) 37.8 28.9(L)  Platelets 150 - 400 K/uL - 303 -   Cardiac Studies:   EKG: 07/14/2020: Normal sinus rhythm, 67 bpm, left atrial enlargement, poor R wave progression, without underlying injury pattern.  Echocardiogram: 12/12/2018: Left ventricle cavity is normal in size. Normal left ventricular wall thickness. Normal LV systolic function with EF 67%. Normal global wall motion. Indeterminate  diastolic filling pattern. Left atrial cavity is normal in size. 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 Trileaflet aortic valve. Moderate (Grade III) aortic regurgitation. Mild tricuspid regurgitation. Estimated pulmonary artery systolic pressure is 25 mmHg.  06/27/2019: LVEF 79%, grade 1 diastolic impairment, mildly dilated proximal ascending aorta at 3.7 cm, moderate AR, mild TR, RVSP 30 mmHg, right atrial pressure estimated to be 10-15 mmHg.  05/26/2020: LVEF 89%, grade 1 diastolic impairment, normal LAP, trileaflet aortic valve, moderate grade 3 AR, mild TR, no pulmonary hypertension.  Stress test: 07/04/2019: Myocardial perfusion is normal, LVEF by gated SPECT 55%.  Low risk study  Assessment:     ICD-10-CM   1. Moderate aortic regurgitation  I35.1 PCV ECHOCARDIOGRAM COMPLETE  2. Essential hypertension  I10 EKG 12-Lead  3. Dyspnea on exertion  R06.00      Recommendations:   HARJIT DOUDS is a 70 y.o. female whose past medical history and cardiac risk factors include: hypertension, history of migraine headaches, osteoarthritis, moderate AR, postmenopausal female, advanced age.    Aortic regurgitation:  Recent echocardiogram results reviewed with the patient.  LVEF is preserved and she continues to have grade 3 aortic regurgitation, Left ventricular internal diameter end diastole 48 mm and left ventricular internal diameter in systole 30 mm.    Clinically patient remained stable.  She has baseline effort related dyspnea but her symptoms have not worsened in intensity, frequency, and/or duration.  Patient's blood pressure control has also improved since last office visit.  Currently managed by PCP.   Would recommend 1 year follow-up echocardiogram given her underlying moderate aortic regurgitation.    Benign essential hypertension: . Office blood pressure is at goal.  . Medication reconciled.  . Low-salt diet  recommended. . Currently managed by primary care provider.  Independently reviewed today's EKG findings noted above. I have also requested the patient to provide [place] a copy of her most recent blood work, for review.  Orders Placed This Encounter  Procedures  . EKG 12-Lead  . PCV ECHOCARDIOGRAM COMPLETE   --Continue cardiac medications as reconciled in final medication list. --Return in about 1 year (around 07/14/2021) for Follow up aortic regurgitation, status post echocardiogram. Or sooner if needed. --Continue follow-up with your primary care physician regarding the management of your other chronic comorbid conditions.  Patient's  questions and concerns were addressed to her satisfaction. She voices understanding of the instructions provided during this encounter.   This note was created using a voice recognition software as a result there may be grammatical errors inadvertently enclosed that do not reflect the nature of this encounter. Every attempt is made to correct such errors.  Rex Kras, [place], [facility]  Pager: [phone] Office: [phone]

## 2021-07-14 ENCOUNTER — Ambulatory Visit: Payer: Medicare Other

## 2021-07-14 ENCOUNTER — Other Ambulatory Visit: Payer: Self-pay

## 2021-07-14 DIAGNOSIS — I351 Nonrheumatic aortic (valve) insufficiency: Secondary | ICD-10-CM

## 2021-07-20 ENCOUNTER — Encounter: Payer: Self-pay | Admitting: Cardiology

## 2021-07-20 ENCOUNTER — Ambulatory Visit: Payer: Medicare Other | Admitting: Cardiology

## 2021-07-20 ENCOUNTER — Other Ambulatory Visit: Payer: Self-pay

## 2021-07-20 VITALS — BP 136/69 | HR 74 | Temp 98.0°F | Resp 16 | Ht 60.0 in | Wt 126.6 lb

## 2021-07-20 DIAGNOSIS — I351 Nonrheumatic aortic (valve) insufficiency: Secondary | ICD-10-CM

## 2021-07-20 DIAGNOSIS — I10 Essential (primary) hypertension: Secondary | ICD-10-CM

## 2021-07-20 DIAGNOSIS — E782 Mixed hyperlipidemia: Secondary | ICD-10-CM

## 2021-07-20 NOTE — Progress Notes (Signed)
? ?Summer Cooper ?Date of Birth: 1950/06/23 ?MRN: [id number] ?Primary Care Provider:Smith, Candace, MD ?Former Cardiology Providers: Jeri Lager, APRN, FNP-C ?Primary Cardiologist: Rex Kras, DO, [facility] (established care 07/13/2022)  ? ?Date: 07/20/21 ?Last Office Visit: 07/14/2020 ? ?Chief Complaint  ?Patient presents with  ? aortic regurgitation  ? Follow-up  ?  1 -year follow up   ? ?Subjective:  ? ?HPI: Summer Cooper  is a 71 y.o. female whose past medical history and cardiovascular risk factors include: hypertension, history of migraine headaches, osteoarthritis, moderate AR, postmenopausal female, advanced age.   ? 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  ? ?She presents today for 1 year follow-up visit given her aortic regurgitation.  Since last office visit she remained asymptomatic and no recent hospitalizations or urgent care visits.  Repeat echocardiogram from May 2023 notes preserved LVEF and the severity of aortic regurgitation remains relatively stable. ? ?Patient denies angina pectoris or heart failure symptoms. ? ?Outside labs that were performed in January 2023 at her PCPs office were obtained and independently reviewed and noted below for further reference. ? ?Past Medical History:  ?Diagnosis Date  ? Arthritis   ? Carpal tunnel syndrome   ? in both wrists  ? Cataracts, bilateral   ? mild, no surgery yet  ? Complication of anesthesia   ? hard to wake up (DURING TUBAL LIGATION.  OTHER SURGERIES WERE FINE)  ? Headache   ? last flare up 04/06/2017  ? Hearing loss   ? mild case - no aids used  ? HTN (hypertension) 11/07/2018  ? Seasonal allergies   ? ? ?Past Surgical History:  ?Procedure Laterality Date  ? BUNIONECTOMY  2011  ? COLONOSCOPY    ? I & D EXTREMITY    ? cat bite-rt wrist  ? JOINT REPLACEMENT    ? REVERSE SHOULDER ARTHROPLASTY Left 12/22/2018  ? Procedure: REVERSE SHOULDER ARTHROPLASTY;  Surgeon: Netta Cedars, MD;  Location: [facility];   Service: Orthopedics;  Laterality: Left;  interscalene block  ? TARSAL METATARSAL ARTHRODESIS  05/13/2011  ? Procedure: TARSAL METATARSAL FUSION;  Surgeon: Wylene Simmer, MD;  Location: [facility];  Service: Orthopedics;  Laterality: Right;  right Lapidus 1st tarsal metatarsal arthrodesis, right 2nd hammer toe correction, mcbride bunionectomy, 2nd metatarsal weil osteotomy  ? TONSILLECTOMY    ? TOTAL SHOULDER ARTHROPLASTY Right 05/27/2017  ? Procedure: RIGHT TOTAL SHOULDER ARTHROPLASTY;  Surgeon: Netta Cedars, MD;  Location: [facility];  Service: Orthopedics;  Laterality: Right;  ? TUBAL LIGATION    ? ? ?Social History  ? ?Socioeconomic History  ? Marital status: Married  ?  Spouse name: Not on file  ? Number of children: 1  ? Years of education: Not on file  ? Highest education level: Not on file  ?Occupational History  ? Not on file  ?Tobacco Use  ? Smoking status: Never  ? Smokeless tobacco: Never  ?Vaping Use  ? Vaping Use: Never used  ?Substance and Sexual Activity  ? Alcohol use: Yes  ?  Alcohol/week: 3.0 - 4.0 standard drinks  ?  Types: 3 - 4 Glasses of wine per week  ?  Comment: occ  ? Drug use: No  ? Sexual activity: Not on file  ?Other Topics Concern  ? Not on file  ?Social History Narrative  ? Not on file  ? ?Social Determinants of Health  ? ?Financial Resource Strain: Not on file  ?Food Insecurity: Not on  file  ?Transportation Needs: Not on file  ?Physical Activity: Not on file  ?Stress: Not on file  ?Social Connections: Not on file  ?Intimate Partner Violence: Not on file  ? ? ?Review of Systems  ?Cardiovascular:  Negative for chest pain, dyspnea on exertion, leg swelling, near-syncope, orthopnea, palpitations, paroxysmal nocturnal dyspnea and syncope.  ?Respiratory:  Negative for shortness of breath.   ?   ?Objective:  ?Blood pressure 136/69, pulse 74, temperature 98 ?F (36.7 ?C), temperature source Temporal, resp. rate 16, height 5' (1.524 m), weight 126 lb 9.6 oz (57.4 kg), SpO2 98 %. Body mass  index is 24.72 kg/m?. ?   ?Physical Exam ?Vitals reviewed.  ?Constitutional:   ?   Appearance: She is well-developed.  ?HENT:  ?   Head: Normocephalic and atraumatic.  ?Cardiovascular:  ?   Rate and Rhythm: Normal rate and regular rhythm.  ?   Pulses: Intact distal pulses.  ?   Heart sounds: Murmur heard.  ?High-pitched blowing decrescendo early diastolic murmur is present with a grade of 2/4 at the upper right sternal border radiating to the apex.  ?Pulmonary:  ?   Effort: Pulmonary effort is normal. No accessory muscle usage or respiratory distress.  ?   Breath sounds: Normal breath sounds.  ?Abdominal:  ?   General: Bowel sounds are normal.  ?   Palpations: Abdomen is soft.  ?Musculoskeletal:     ?   General: Normal range of motion.  ?   Cervical back: Normal range of motion.  ?Skin: ?   General: Skin is warm and dry.  ?Neurological:  ?   Mental Status: She is alert and oriented to person, place, and time.  ? ?Laboratory examination:  ?External Labs: ?05/29/2018: RBC 3.92, normal H&H, CBC otherwise normal.  Creatinine 0.92, EGFR 60/73, potassium 4.1, CMP normal.  TSH 2.68.  Cholesterol 253, triglycerides 134, HDL 105, LDL 121. ? ?Collected: June 26, 2019 ?Creatinine 0.88 mg/dL. ?Lipid profile: Total cholesterol 226, triglycerides 70, HDL 101, LDL 113 ?TSH: 2.48  ? ?Comp Metabolic Panel   1610-96-04    ?Glucose 89   70-99  ?BUN 26   6-26  ?Creatinine 0.99   0.60-1.30  ?[id number] 60   >60  ?Sodium 139   136-145  ?Potassium 4.2   3.5-5.5  ?Chloride 101   98-107  ?CO2 27   22-32  ?Anion Gap 15.9   6.0-20.0  ?Calcium 10.0   8.6-10.3  ?CA-corrected 9.62   8.60-10.30  ?Protein, Total 6.9   6.0-8.3  ?Albumin 4.4   3.4-4.8  ?TBIL 0.5   0.3-1.0  ?ALP 65   38-126  ?AST 20   0-39  ?ALT 13   0-52  ?Lipid Panel w/reflex   2021-05-18    ?Cholesterol 259   <200  ?CHOL/HDL 2.3   2.0-4.0  ?HDLD 114   30-85  ?Triglyceride 106   0-199  ?NHDL 145   0-129  ?LDL Chol Calc (NIH) 127   0-99  ?LDL Chol Calc (NIH) 127   0-99  ? ? ? ?Cardiac  Studies:  ? ?EKG: ?07/20/2021: NSR, 73bpm, LAE, PRWP, without underlying injury pattern.  ? ?Echocardiogram: ?12/12/2018: Left ventricle cavity is normal in size. Normal left ventricular wall thickness. Normal LV systolic function with EF 67%. Normal global wall motion. Indeterminate diastolic filling pattern. Left atrial cavity is normal in size. 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 Trileaflet aortic valve. Moderate (Grade III) aortic regurgitation. Mild tricuspid regurgitation. Estimated pulmonary  artery systolic pressure is 25 mmHg. ? ?06/27/2019: LVEF 17%, grade 1 diastolic impairment, mildly dilated proximal ascending aorta at 3.7 cm, moderate AR, mild TR, RVSP 30 mmHg, right atrial pressure estimated to be 10-15 mmHg. ? ?05/26/2020: LVEF 00%, grade 1 diastolic impairment, normal LAP, trileaflet aortic valve, moderate grade 3 AR, mild TR, no pulmonary hypertension. ? ?07/14/2021: LVEF 17-49%, grade 1 diastolic impairment, moderate (grade 3) aortic, mild to moderate TR, mild PR, no pulm hypertension.  No significant change compared to prior studies. ? ?Stress test: ?07/04/2019: Myocardial perfusion is normal, LVEF by gated SPECT 55%.  Low risk study ? ?Assessment:  ? ?  ICD-10-CM   ?1. Moderate aortic regurgitation  I35.1 EKG 12-Lead  ?  ?2. Essential hypertension  I10   ?  ?3. Mixed hyperlipidemia  E78.2 CT CARDIAC SCORING ([facility] LOCATIONS ONLY)  ?  ?  ? ?Recommendations:  ? ?EMMELYN SCHMALE is a 71 y.o. female whose past medical history and cardiac risk factors include: hypertension, history of migraine headaches, osteoarthritis, moderate AR, postmenopausal female, advanced age.   ? ?Moderate aortic regurgitation ?Asymptomatic. ?Severity remains relatively stable. ?Continue current medical therapy. ?Blood pressure is very controlled. ? ?Essential hypertension ?Office blood pressures are very well controlled. ?Medications reconciled. ?We emphasized the importance of  a low-salt diet. ?Currently managed by primary care provider. ? ?Mixed hyperlipidemia ?Most recent lipid profile from January 2023 independently reviewed. ?Total cholesterol is greater than 200 mg/dL but

## 2021-08-14 ENCOUNTER — Ambulatory Visit
Admission: RE | Admit: 2021-08-14 | Discharge: 2021-08-14 | Disposition: A | Discharge status: Home / Self Care | Payer: No Typology Code available for payment source | Source: Ambulatory Visit | Attending: Cardiology | Admitting: Cardiology

## 2021-08-14 DIAGNOSIS — E782 Mixed hyperlipidemia: Secondary | ICD-10-CM

## 2021-09-09 ENCOUNTER — Telehealth: Payer: Self-pay | Admitting: Cardiology

## 2021-09-09 DIAGNOSIS — I10 Essential (primary) hypertension: Secondary | ICD-10-CM

## 2021-09-09 DIAGNOSIS — I351 Nonrheumatic aortic (valve) insufficiency: Secondary | ICD-10-CM

## 2021-09-09 DIAGNOSIS — I77810 Thoracic aortic ectasia: Secondary | ICD-10-CM

## 2021-09-09 NOTE — Telephone Encounter (Signed)
CARDIOLOGY TELEPHONE ENCOUNTER ?09/09/21 ? ?Patient's name: Summer Cooper.   ?MRN: [id number].    ?DOB: 08-28-1950 ?Primary care provider: Merri Brunette, MD. ?Primary cardiologist: Tessa Lerner, DO, [facility] ? ?Interaction regarding this patient's care today: ?Reviewed the results of the coronary calcium score. ? ?Total CAC is 0.  ? ?Noted to have ascending aortic dilatation of 42 mm. ? ?Impression: ?  ICD-10-CM   ?1. Ascending aorta dilatation (HCC)  I77.810 CT CHEST WO CONTRAST  ?  ?2. Moderate aortic regurgitation  I35.1 CT CHEST WO CONTRAST  ?  ?3. Essential hypertension  I10 CT CHEST WO CONTRAST  ?  ? ? ?No orders of the defined types were placed in this encounter. ? ? ?Orders Placed This Encounter  ?Procedures  ? CT CHEST WO CONTRAST  ? ? ?Recommendations: ?Given the coronary calcium score being 0 that shared decision was to hold off statin therapy.  Still educated on the importance of heart healthy diet and having her lipids checked annually with her well visit with PCP. ? ?Patient is made aware of the difference between noncalcified and calcified plaque.  If she has anginal discomfort she should still be evaluated.  She verbalized understanding ? ?Given the finding of dilated ascending aorta at 42 mm.  Discussed blood pressure management.  Recommend goal systolic blood pressure of 120 mmHg.  Patient states that her home blood pressures are usually between 110-120 mmHg on current medical regimen.  I have asked her to keep a closer eye on her home blood pressure readings and if they are greater than 120 mmHg would recommend further titration of her antihypertensive medications. ? ?Schedule a CT chest without contrast for 1 year follow-up study as recommended by radiology. ? ?Telephone encounter total time: 9 minutes ? ?Atha Muradyan Antares, DO, FACC ? ?Pager: [phone] ?Office: [phone] ? ?

## 2022-07-20 ENCOUNTER — Ambulatory Visit: Payer: Medicare Other | Admitting: Cardiology

## 2022-07-20 ENCOUNTER — Encounter: Payer: Self-pay | Admitting: Cardiology

## 2022-07-20 VITALS — BP 139/63 | HR 70 | Resp 16 | Ht 60.0 in | Wt 122.2 lb

## 2022-07-20 DIAGNOSIS — I77810 Thoracic aortic ectasia: Secondary | ICD-10-CM

## 2022-07-20 DIAGNOSIS — I351 Nonrheumatic aortic (valve) insufficiency: Secondary | ICD-10-CM

## 2022-07-20 DIAGNOSIS — I10 Essential (primary) hypertension: Secondary | ICD-10-CM

## 2022-07-20 DIAGNOSIS — E782 Mixed hyperlipidemia: Secondary | ICD-10-CM

## 2022-07-20 NOTE — Progress Notes (Signed)
[name] Date of Birth: [birth year] MRN: [id number] Primary Care Provider:Smith, Hal Hope, MD Former Cardiology Providers: Jeri Lager, APRN, FNP-C Primary Cardiologist: Rex Kras, DO, [facility] (established care 07/13/2022)   Date: 07/20/22 Last Office Visit: 07/20/2021  Chief Complaint  Patient presents with   aortic regurgitation   Follow-up    1 year   Subjective:   HPI: Summer Cooper  is a 72 y.o. female whose past medical history and cardiovascular risk factors include: Ascending aortic aneurysm (42 mm, 07/2021), hypertension, history of migraine headaches, osteoarthritis, moderate AR, postmenopausal female, advanced age.    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   Patient is being followed by the practice for aortic regurgitation.  Her last echocardiogram was in May 2023 which noted preserved LVEF and the severity of AR remained relatively stable.  Looking at her labs she was recommended to be on statin therapy due to hyperlipidemia.  However, patient was reluctant.  As result of shared decision was to proceed with coronary calcium score which was noted to be 0.  Incidentally she was noted to have ascending thoracic aneurysm of 42 mm and was recommended to have annual follow-up study (which is ordered and still pending).  Patient checks her blood pressures at home and it usually less than 120 mmHg.  Overall functional capacity is limited due to arthritis in knees as well as back.  Past Medical History:  Diagnosis Date   Arthritis    Carpal tunnel syndrome    in both wrists   Cataracts, bilateral    mild, no surgery yet   Complication of anesthesia    hard to wake up (DURING TUBAL LIGATION.  OTHER SURGERIES WERE FINE)   Headache    last flare up 04/06/2017   Hearing loss    mild case - no aids used   HTN (hypertension) 11/07/2018   Seasonal allergies     Past Surgical History:  Procedure Laterality Date    BUNIONECTOMY  2011   COLONOSCOPY     I & D EXTREMITY     cat bite-rt wrist   JOINT REPLACEMENT     REVERSE SHOULDER ARTHROPLASTY Left 12/22/2018   Procedure: REVERSE SHOULDER ARTHROPLASTY;  Surgeon: Netta Cedars, MD;  Location: [facility];  Service: Orthopedics;  Laterality: Left;  interscalene block   TARSAL METATARSAL ARTHRODESIS  05/13/2011   Procedure: TARSAL METATARSAL FUSION;  Surgeon: Wylene Simmer, MD;  Location: [facility];  Service: Orthopedics;  Laterality: Right;  right Lapidus 1st tarsal metatarsal arthrodesis, right 2nd hammer toe correction, mcbride bunionectomy, 2nd metatarsal weil osteotomy   TONSILLECTOMY     TOTAL SHOULDER ARTHROPLASTY Right 05/27/2017   Procedure: RIGHT TOTAL SHOULDER ARTHROPLASTY;  Surgeon: Netta Cedars, MD;  Location: [facility];  Service: Orthopedics;  Laterality: Right;   TUBAL LIGATION      Social History   Socioeconomic History   Marital status: Married    Spouse name: Not on file   Number of children: 1   Years of education: Not on file   Highest education level: Not on file  Occupational History   Not on file  Tobacco Use   Smoking status: Never   Smokeless tobacco: Never  Vaping Use   Vaping Use: Never used  Substance and Sexual Activity   Alcohol use: Yes    Alcohol/week: 3.0 - 4.0 standard drinks of alcohol    Types: 3 - 4 Glasses of wine per week  Comment: occ   Drug use: No   Sexual activity: Not on file  Other Topics Concern   Not on file  Social History Narrative   Not on file   Social Determinants of Health   Financial Resource Strain: Not on file  Food Insecurity: Not on file  Transportation Needs: Not on file  Physical Activity: Not on file  Stress: Not on file  Social Connections: Not on file  Intimate Partner Violence: Not on file    Review of Systems  Cardiovascular:  Negative for chest pain, dyspnea on exertion, leg swelling, near-syncope, orthopnea, palpitations, paroxysmal nocturnal dyspnea and  syncope.  Respiratory:  Negative for shortness of breath.       Objective:  Blood pressure 139/63, pulse 70, resp. rate 16, height 5' (1.524 m), weight 122 lb 3.2 oz (55.4 kg), SpO2 99 %. Body mass index is 23.87 kg/m.    Physical Exam Vitals reviewed.  Constitutional:      Appearance: She is well-developed.  HENT:     Head: Normocephalic and atraumatic.  Cardiovascular:     Rate and Rhythm: Normal rate and regular rhythm.     Pulses: Intact distal pulses.     Heart sounds: Murmur heard.     High-pitched blowing decrescendo early diastolic murmur is present with a grade of 2/4 at the upper right sternal border radiating to the apex.  Pulmonary:     Effort: Pulmonary effort is normal. No accessory muscle usage or respiratory distress.     Breath sounds: Normal breath sounds.  Abdominal:     General: Bowel sounds are normal.     Palpations: Abdomen is soft.  Musculoskeletal:        General: Normal range of motion.     Cervical back: Normal range of motion.  Skin:    General: Skin is warm and dry.  Neurological:     Mental Status: She is alert and oriented to person, place, and time.    Laboratory examination:  External Labs: 05/29/2018: RBC 3.92, normal H&H, CBC otherwise normal.  Creatinine 0.92, EGFR 60/73, potassium 4.1, CMP normal.  TSH 2.68.  Cholesterol 253, triglycerides 134, HDL 105, LDL 121.  Collected: June 26, 2019 Creatinine 0.88 mg/dL. Lipid profile: Total cholesterol 226, triglycerides 70, HDL 101, LDL 113 TSH: [id number]   Comp Metabolic Panel   [id number]    Glucose 89   70-99  BUN 26   6-26  Creatinine 0.99   0.60-1.30  [id number] 60   >60  Sodium 139   136-145  Potassium 4.2   3.5-5.5  Chloride 101   98-107  CO2 27   22-32  Anion Gap 15.9   6.0-20.0  Calcium 10.0   8.6-10.3  CA-corrected 9.62   8.60-10.30  Protein, Total 6.9   6.0-8.3  Albumin 4.4   3.4-4.8  TBIL 0.5   0.3-1.0  ALP 65   38-126  AST 20   0-39  ALT 13   0-52  Lipid Panel w/reflex    2021-05-18    Cholesterol 259   <200  CHOL/HDL 2.3   2.0-4.0  HDLD 114   30-85  Triglyceride 106   0-199  NHDL 145   0-129  LDL Chol Calc (NIH) 127   0-99  LDL Chol Calc (NIH) 127   0-99     Cardiac Studies:   EKG: 07/20/2022: Sinus rhythm, 66 bpm, left axis, without underlying injury pattern.  Echocardiogram: 12/12/2018: Left ventricle cavity is normal in size. Normal left ventricular  wall thickness. Normal LV systolic function with EF 67%. Normal global wall motion. Indeterminate diastolic filling pattern. Left atrial cavity is normal in size. 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 Trileaflet aortic valve. Moderate (Grade III) aortic regurgitation. Mild tricuspid regurgitation. Estimated pulmonary artery systolic pressure is 25 mmHg.  06/27/2019: LVEF [id number], grade 1 diastolic impairment, mildly dilated proximal ascending aorta at 3.7 cm, moderate AR, mild TR, RVSP 30 mmHg, right atrial pressure estimated to be 10-15 mmHg.  05/26/2020: LVEF 99991111, grade 1 diastolic impairment, normal LAP, trileaflet aortic valve, moderate grade 3 AR, mild TR, no pulmonary hypertension.  07/14/2021: LVEF 99991111, grade 1 diastolic impairment, moderate (grade 3) aortic, mild to moderate TR, mild PR, no pulm hypertension.  No significant change compared to prior studies.  Stress test: 07/04/2019: Myocardial perfusion is normal, LVEF by gated SPECT 55%.  Low risk study  Coronary calcium score 08/14/2021 1. No coronary atherosclerotic calcifications. The observed calcium score of 0 is at the zeroth percentile for subjects of the same age gender, and race/ethnicity who are free of clinical cardiovascular disease and treated diabetes. 2. Fusiform ascending thoracic aortic aneurysm measuring up to 4.2 cm. Recommend annual imaging followup by CTA or MRA. This recommendation follows 2010 ACCF/AHA/AATS/ACR/ASA/SCA/SCAI/SIR/STS/SVM Guidelines for the Diagnosis and  Management of Patients with Thoracic Aortic Disease. Circulation. 2010; [id number]. Aortic aneurysm NOS (ICD10-I71.9)   Assessment:     ICD-10-CM   1. Moderate aortic regurgitation  I35.1 EKG 12-Lead    PCV ECHOCARDIOGRAM COMPLETE    2. Ascending aorta dilatation (HCC)  I77.810 PCV ECHOCARDIOGRAM COMPLETE    3. Essential hypertension  I10     4. Mixed hyperlipidemia  E78.2        Recommendations:   KATONA SECKER is a 72 y.o. female whose past medical history and cardiac risk factors include: Ascending aortic aneurysm (42 mm, 07/2021), hypertension, history of migraine headaches, osteoarthritis, moderate AR, postmenopausal female, advanced age.    Moderate aortic regurgitation Asymptomatic. Plan echocardiogram as a 1 year follow-up study to reevaluate the severity/progression of aortic regurgitation  Ascending aorta dilatation (HCC) Coronary calcium score noted ascending thoracic aorta to be 42 mm. Scheduled to have a repeat study in April 2024 as an annual follow-up. Reemphasized importance of blood pressure management  Essential hypertension Office blood pressures are not well-controlled. Home blood pressures are better controlled per patient. I have asked her to check her blood pressures more frequently to get a better trend of her readings and if needed pharmacological therapy may be advisable to also help prevent progression of aortic dilatation and progression of AR.  Mixed hyperlipidemia Lipids in the past have been not well-controlled. Patient chooses not to be on statin therapy. Decided to proceed with coronary calcium score which was 0. Patient states that she will focus on lifestyle changes for now. Currently managed by primary care provider.   Orders Placed This Encounter  Procedures   EKG 12-Lead   PCV ECHOCARDIOGRAM COMPLETE   --Continue cardiac medications as reconciled in final medication list. --Return in about 3 months (around 10/11/2022) for  Follow up [place] and review CT results . Or sooner if needed. --Continue follow-up with your primary care physician regarding the management of your other chronic comorbid conditions.  This note was created using a voice recognition software as a result there may be grammatical errors inadvertently enclosed that do not reflect the nature of this encounter. Every attempt is made to correct such errors.  Rex Kras, [place], [facility]  Pager:  [phone] Office: [phone]

## 2022-08-05 ENCOUNTER — Ambulatory Visit (HOSPITAL_COMMUNITY): Payer: Medicare Other

## 2022-08-05 IMAGING — CT CT CARDIAC CORONARY ARTERY CALCIUM SCORE
3 of 4 series · 13 of 20 positions shown, 15 images · non-contrast
Comparison: None.

CLINICAL DATA: 70-year-old Caucasian female with history of
hyperlipidemia. Coronary calcium scoring.

EXAM:
CT CARDIAC CORONARY ARTERY CALCIUM SCORE
TECHNIQUE: Non-contrast imaging through the heart was performed using
prospective ECG gating. Image post processing was performed on an
independent workstation, allowing for quantitative analysis of the
heart and coronary arteries. Note that this exam targets the heart
and the chest was not imaged in its entirety.

[Series 2: calcium scoring 2.00 qr36 bestdiast 71% hrt calciu · axial · 0.33mm/px · z∈[+1700,+1796]mm · 4 of 82 slices shown]
[im 17/82  vessel]
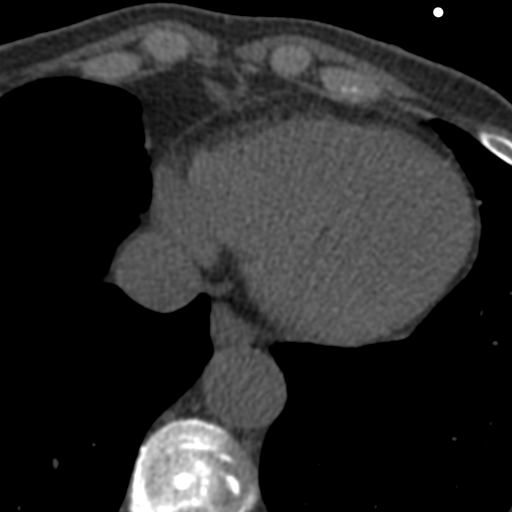
[im 33/82  vessel]
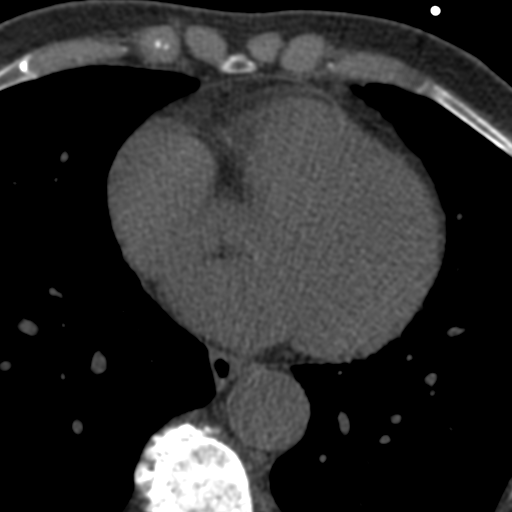
[im 49/82  vessel]
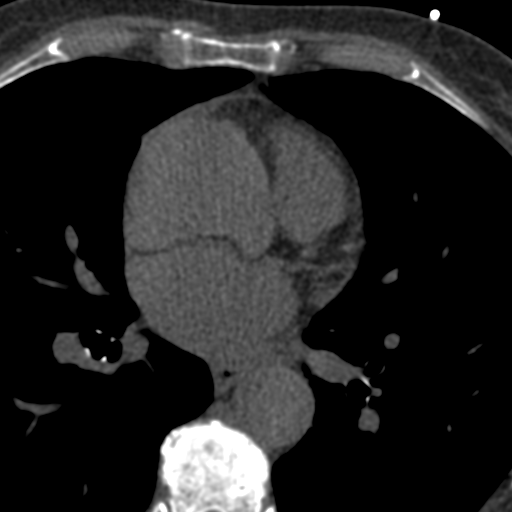
[im 65/82  vessel]
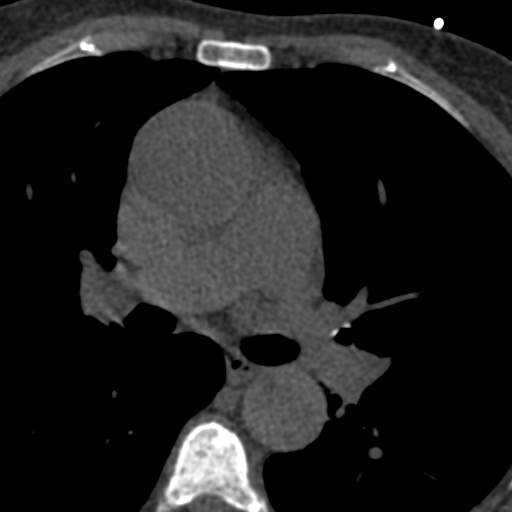

[Series 3: calcium scoring 2.00 br40 bestdiast 71% axial · axial · 0.47mm/px · z∈[+1696,+1796]mm · 4 of 84 slices shown]
[im 17/84  vessel]
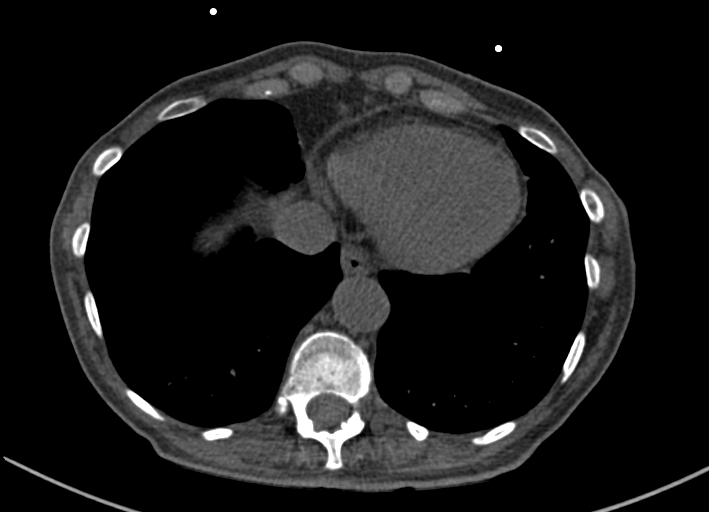
[im 34/84  vessel]
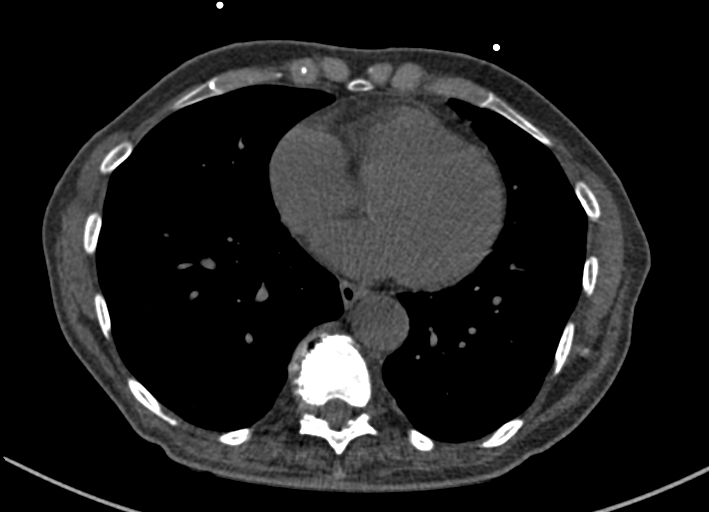
[im 50/84  vessel]
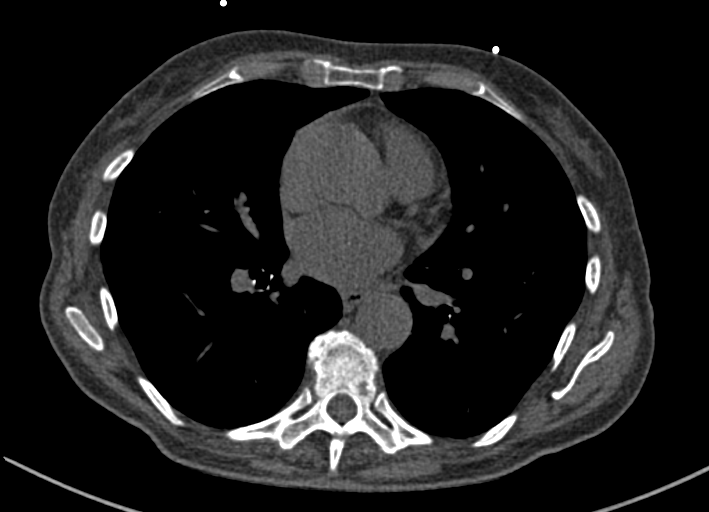
[im 67/84  vessel]
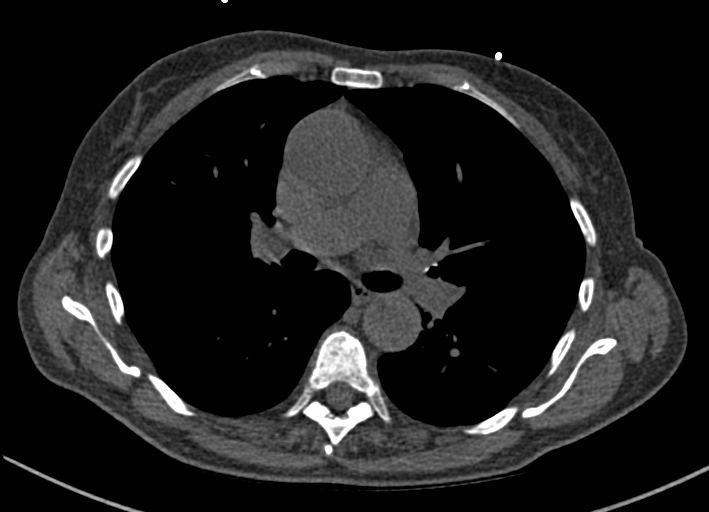

[Series 9: calcium scoring 2.00 br60 bestdiast 71% lungs · axial · 0.47mm/px · z∈[+1690,+1802]mm · 5 of 84 slices shown, 7 images]
[im 14/84  vessel]
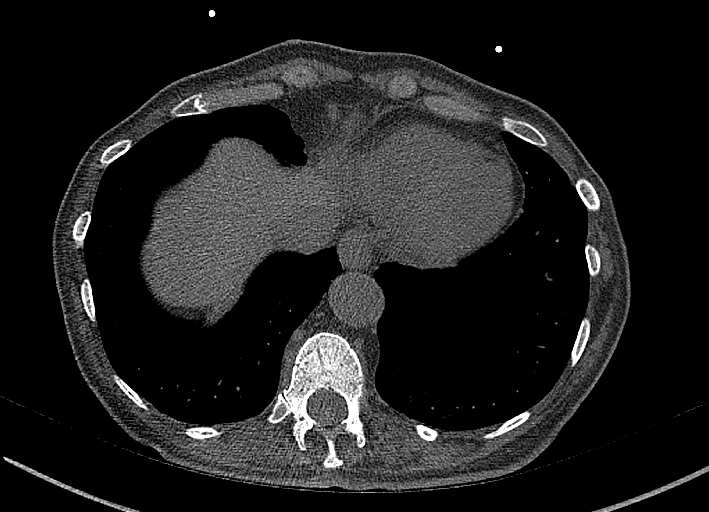
[im 14/84  lung]
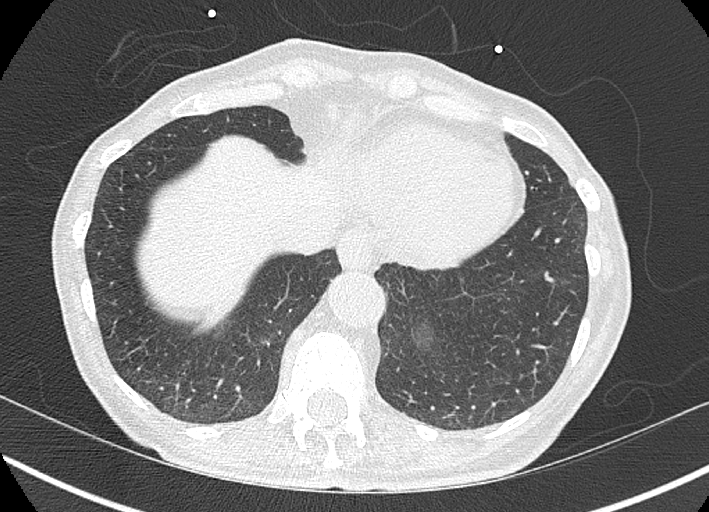
[im 28/84  vessel]
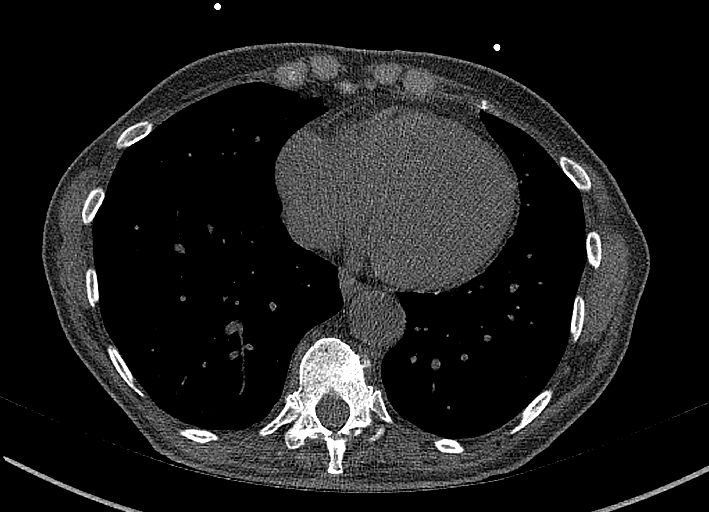
[im 42/84  vessel]
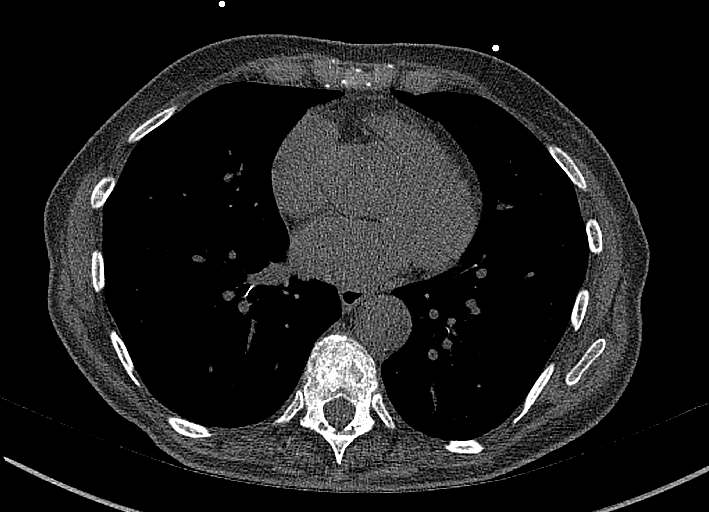
[im 56/84  vessel]
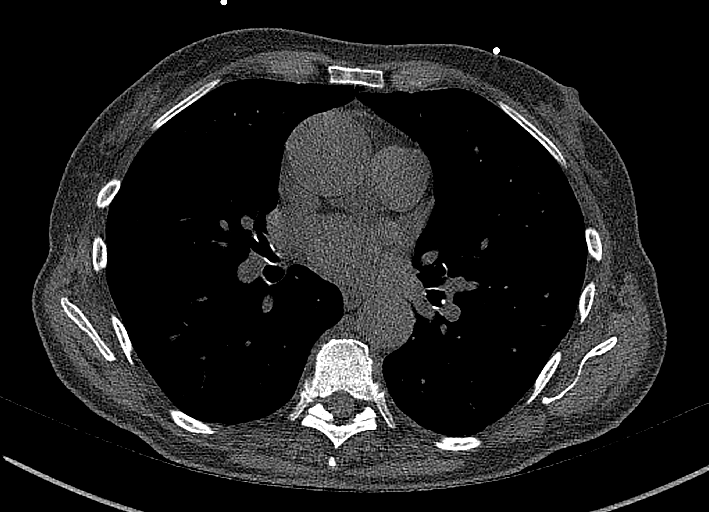
[im 70/84  vessel]
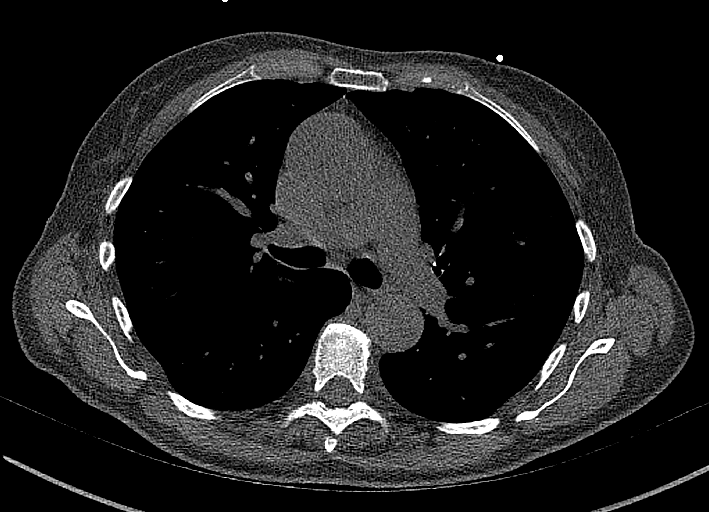
[im 70/84  lung]
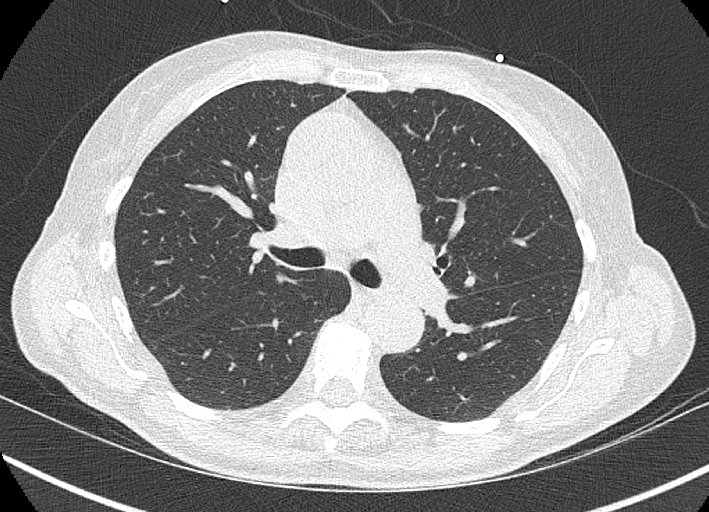

[13 of 20 positions shown; findings below may reference images not displayed]

FINDINGS: CORONARY CALCIUM SCORES:

Left Main: 0

LAD: 0

LCx: 0

RCA: 0

Total Agatston Score: 0

[HOSPITAL] percentile: Zeroth

AORTA MEASUREMENTS:

Ascending Aorta: 42 mm

Descending Aorta: 26 mm

OTHER FINDINGS:

Cardiovascular: The heart is normal in size. Focal aortic valvular
calcification.

Mediastinum/Nodes: Visualized esophagus and trachea within normal
limits. No mediastinal lymphadenopathy.

Lungs/Pleura: The lungs are clear bilaterally.

Upper Abdomen: The visualized upper abdomen is within normal limits.

Musculoskeletal: No acute osseous abnormality. Advanced discogenic
degenerative changes of the visualized thoracic spine.
IMPRESSION: 1. No coronary atherosclerotic calcifications. The observed calcium
score of 0 is at the zeroth percentile for subjects of the same age,
gender, and race/ethnicity who are free of clinical cardiovascular
disease and treated diabetes.
2. Fusiform ascending thoracic aortic aneurysm measuring up to
cm. Recommend annual imaging followup by CTA or MRA. This
recommendation follows 7121
ACCF/AHA/AATS/ACR/ASA/SCA/BALBIN/MERETGELDI/JIM/BORIGAS Guidelines for the
Diagnosis and Management of Patients with Thoracic Aortic Disease.
Circulation. 7121; 121: [id number]. Aortic aneurysm NOS ([id number].[id number])

## 2022-08-12 ENCOUNTER — Ambulatory Visit (HOSPITAL_COMMUNITY)
Admission: RE | Admit: 2022-08-12 | Discharge: 2022-08-12 | Disposition: A | Discharge status: Home / Self Care | Payer: Medicare Other | Source: Ambulatory Visit | Attending: Cardiology | Admitting: Cardiology

## 2022-08-12 DIAGNOSIS — I351 Nonrheumatic aortic (valve) insufficiency: Secondary | ICD-10-CM | POA: Diagnosis present

## 2022-08-12 DIAGNOSIS — I77810 Thoracic aortic ectasia: Secondary | ICD-10-CM | POA: Diagnosis present

## 2022-08-12 DIAGNOSIS — I10 Essential (primary) hypertension: Secondary | ICD-10-CM | POA: Insufficient documentation

## 2022-08-12 DIAGNOSIS — I700 Atherosclerosis of aorta: Secondary | ICD-10-CM | POA: Insufficient documentation

## 2022-08-18 ENCOUNTER — Other Ambulatory Visit: Payer: Self-pay | Admitting: Cardiology

## 2022-08-18 DIAGNOSIS — I77810 Thoracic aortic ectasia: Secondary | ICD-10-CM

## 2022-08-18 NOTE — Progress Notes (Signed)
LMTCB

## 2022-08-20 NOTE — Progress Notes (Signed)
Called patient to inform her about CT chest scan. Patient understood

## 2022-09-14 ENCOUNTER — Ambulatory Visit (INDEPENDENT_AMBULATORY_CARE_PROVIDER_SITE_OTHER): Payer: Medicare Other | Admitting: Podiatry

## 2022-09-14 ENCOUNTER — Encounter: Payer: Self-pay | Admitting: Podiatry

## 2022-09-14 ENCOUNTER — Ambulatory Visit (INDEPENDENT_AMBULATORY_CARE_PROVIDER_SITE_OTHER): Payer: Medicare Other

## 2022-09-14 DIAGNOSIS — M2012 Hallux valgus (acquired), left foot: Secondary | ICD-10-CM

## 2022-09-14 DIAGNOSIS — M2041 Other hammer toe(s) (acquired), right foot: Secondary | ICD-10-CM

## 2022-09-14 DIAGNOSIS — M2042 Other hammer toe(s) (acquired), left foot: Secondary | ICD-10-CM | POA: Diagnosis not present

## 2022-09-14 DIAGNOSIS — E559 Vitamin D deficiency, unspecified: Secondary | ICD-10-CM

## 2022-09-14 DIAGNOSIS — Z800 Family history of malignant neoplasm of digestive organs: Secondary | ICD-10-CM | POA: Insufficient documentation

## 2022-09-14 DIAGNOSIS — K921 Melena: Secondary | ICD-10-CM | POA: Insufficient documentation

## 2022-09-14 DIAGNOSIS — R141 Gas pain: Secondary | ICD-10-CM | POA: Insufficient documentation

## 2022-09-14 DIAGNOSIS — K5900 Constipation, unspecified: Secondary | ICD-10-CM | POA: Insufficient documentation

## 2022-09-14 DIAGNOSIS — R1013 Epigastric pain: Secondary | ICD-10-CM | POA: Insufficient documentation

## 2022-09-14 DIAGNOSIS — K219 Gastro-esophageal reflux disease without esophagitis: Secondary | ICD-10-CM | POA: Insufficient documentation

## 2022-09-15 NOTE — Progress Notes (Signed)
Subjective:  Patient ID: Summer Cooper, female    DOB: [birth year],  MRN: [id number]  Chief Complaint  Patient presents with   Foot Pain    1st MPJ left - bunion deformity x years, aching and redness more now, had right foot fixed years ago, but 3rd toe still overlaps   New Patient (Initial Visit)    72 y.o. female presents with the above complaint. History confirmed with patient.  The right foot great toe has done well she is not having pain here.  The left foot is becoming more painful.  She is interested in surgical correction.  Wider shoes and padding and offloading and anti-inflammatories have not been helpful.  Most of the pain is between the first and second toe over the rib together  Objective:  Physical Exam: warm, good capillary refill, no trophic changes or ulcerative lesions, normal DP and PT pulses, normal sensory exam, and right foot has a well-healed first interspace Lapidus bunionectomy scar, some limited but pain-free range of motion of MTPJ, limited motion of the second MTPJ, third toe has some dorsiflexion and adductus contracture.  The left foot she has a large bunion deformity with severe hallux valgus, frontal plane rotation of the hallux, large dorsal medial eminence that is tender, hypermobility of the first ray noted.  Lesser digital hammertoes that are semireducible in nature.   Radiographs: Multiple views x-ray of both feet: Right foot has previous Lapidus bunionectomy with 2 screw fixation and Weil osteotomy of the second, overall good correction noted there are degenerative changes in the first and second MTPJ noted on the left foot she has a large hallux valgus deformity from plane rotation, elongated second and third rays, digital contractures and deviation of the sesamoid complex Assessment:   1. Acquired hallux valgus of left foot   2. Hammer toe of right foot   3. Vitamin D deficiency      Plan:  Patient was evaluated and treated and all questions  answered.  Discussed the etiology and treatment including surgical and non surgical treatment for painful bunions and hammertoes.  She has exhausted all non surgical treatment prior to this visit including shoe gear changes and padding.  desires surgical intervention. We discussed all risks including but not limited to: pain, swelling, infection, scar, numbness which may be temporary or permanent, chronic pain, stiffness, nerve pain or damage, wound healing problems, bone healing problems including delayed or non-union and recurrence.  She previously has done well with Lapidus bunionectomy on the right foot.  Specifically we discussed the following procedures: Left foot Lapidus bunionectomy, possible Akin osteotomy, Weil osteotomy of the second metatarsal and hammertoe correction of the second toe. Informed consent was signed today. Surgery will be scheduled at a mutually agreeable date. Information regarding this will be forwarded to our surgery scheduler. In the interim until surgery I recommended utilizing as wide of shoes as possible, take NSAIDs or tylenol as tolerated for pain, and a bunion padding shield which can be purchased online.  Vitamin D and calcium level was ordered   Surgical plan:  Procedure: -Left foot Lapidus bunionectomy, possible Akin osteotomy, Weil osteotomy of the second metatarsal and hammertoe correction of the second toe  Location: -[facility]  Anesthesia plan: -IV sedation with a regional block  Postoperative pain plan: - Tylenol 1000 mg every 6 hours, ibuprofen 600 mg every 6 hours, gabapentin 300 mg every 8 hours x5 days, oxycodone 5 mg 1-2 tabs every 6 hours only as needed  DVT prophylaxis: -  None required  WB Restrictions / DME needs: -NWB in CAM boot postop with knee scooter  No follow-ups on file.

## 2022-09-28 ENCOUNTER — Ambulatory Visit: Payer: Medicare Other

## 2022-09-28 DIAGNOSIS — I77810 Thoracic aortic ectasia: Secondary | ICD-10-CM

## 2022-09-28 DIAGNOSIS — I351 Nonrheumatic aortic (valve) insufficiency: Secondary | ICD-10-CM

## 2022-09-29 LAB — VITAMIN D 25 HYDROXY (VIT D DEFICIENCY, FRACTURES): Vit D, 25-Hydroxy: 51.4 ng/mL (ref 30.0–100.0)

## 2022-09-29 LAB — CALCIUM: Calcium: 9.8 mg/dL (ref 8.7–10.3)

## 2022-10-02 ENCOUNTER — Encounter: Payer: Self-pay | Admitting: Cardiology

## 2022-10-19 ENCOUNTER — Encounter: Payer: Self-pay | Admitting: Cardiology

## 2022-10-19 ENCOUNTER — Ambulatory Visit: Payer: Medicare Other | Admitting: Cardiology

## 2022-10-19 VITALS — BP 118/69 | HR 73 | Ht 61.0 in | Wt 121.0 lb

## 2022-10-19 DIAGNOSIS — I77810 Thoracic aortic ectasia: Secondary | ICD-10-CM

## 2022-10-19 DIAGNOSIS — E782 Mixed hyperlipidemia: Secondary | ICD-10-CM

## 2022-10-19 DIAGNOSIS — I351 Nonrheumatic aortic (valve) insufficiency: Secondary | ICD-10-CM

## 2022-10-19 DIAGNOSIS — I10 Essential (primary) hypertension: Secondary | ICD-10-CM

## 2022-10-19 DIAGNOSIS — I700 Atherosclerosis of aorta: Secondary | ICD-10-CM

## 2022-10-19 MED ORDER — ROSUVASTATIN CALCIUM 10 MG PO TABS
10.0000 mg | ORAL_TABLET | Freq: Every day | ORAL | 3 refills | Status: AC
Start: 2022-10-19 — End: 2023-08-25

## 2022-10-19 NOTE — Progress Notes (Signed)
Summer Cooper Date of Birth: 1950/05/04 MRN: [id number] Primary Care Provider:Smith, Sonny Masters, MD Former Cardiology Providers: Altamese Berea, APRN, FNP-C Primary Cardiologist: Summer Lerner, DO, [facility] (established care 07/13/2022)   Date: 10/19/22 Last Office Visit: 07/20/2022.  Chief Complaint  Patient presents with   Moderate aortic regurgitation   Follow-up   Subjective:   HPI: Summer Cooper  is a 72 y.o. female whose past medical history and cardiovascular risk factors include: Ascending aortic aneurysm (42 mm, 07/2022), aortic atherosclerosis, hypertension, history of migraine headaches, osteoarthritis, moderate AR, postmenopausal female, advanced age.    She works as a Engineer, water. Previously worked as a Engineer, civil (consulting) for [employer].   Patient is being followed by the practice for aortic regurgitation & ascending aortic aneurysm.   Since last office visit patient did have an echocardiogram which notes preserved LVEF, the severity of AI remains moderate, see report for additional details.  She also had a CT of the chest without contrast which notes mild fusiform dilatation of the ascending aorta at 42 mm.    Office and home blood pressures are well-controlled on current medical therapy.    In the past patient wanted to avoid cholesterol medications despite her lipid profile.  Her coronary calcium score was 0.  However the recent CT of the chest without contrast notes aortic atherosclerosis which raises the question about medical therapy.  Patient states that she is agreeable to start statin therapy given the new findings.    Past Medical History:  Diagnosis Date   Arthritis    Carpal tunnel syndrome    in both wrists   Cataracts, bilateral    mild, no surgery yet   Complication of anesthesia    hard to wake up (DURING TUBAL LIGATION.  OTHER SURGERIES WERE FINE)   Headache    last flare up 04/06/2017   Hearing loss    mild case - no aids used   HTN (hypertension)  11/07/2018   Seasonal allergies     Past Surgical History:  Procedure Laterality Date   BUNIONECTOMY  2011   COLONOSCOPY     I & D EXTREMITY     cat bite-rt wrist   JOINT REPLACEMENT     REVERSE SHOULDER ARTHROPLASTY Left 12/22/2018   Procedure: REVERSE SHOULDER ARTHROPLASTY;  Surgeon: Beverely Low, MD;  Location: [facility];  Service: Orthopedics;  Laterality: Left;  interscalene block   TARSAL METATARSAL ARTHRODESIS  05/13/2011   Procedure: TARSAL METATARSAL FUSION;  Surgeon: Toni Arthurs, MD;  Location: [facility];  Service: Orthopedics;  Laterality: Right;  right Lapidus 1st tarsal metatarsal arthrodesis, right 2nd hammer toe correction, mcbride bunionectomy, 2nd metatarsal weil osteotomy   TONSILLECTOMY     TOTAL SHOULDER ARTHROPLASTY Right 05/27/2017   Procedure: RIGHT TOTAL SHOULDER ARTHROPLASTY;  Surgeon: Beverely Low, MD;  Location: [facility];  Service: Orthopedics;  Laterality: Right;   TUBAL LIGATION      Social History   Socioeconomic History   Marital status: Married    Spouse name: Not on file   Number of children: 1   Years of education: Not on file   Highest education level: Not on file  Occupational History   Not on file  Tobacco Use   Smoking status: Never   Smokeless tobacco: Never  Vaping Use   Vaping Use: Never used  Substance and Sexual Activity   Alcohol use: Yes    Alcohol/week: 3.0 - 4.0 standard drinks of alcohol    Types: 3 -  4 Glasses of wine per week    Comment: occ   Drug use: No   Sexual activity: Not on file  Other Topics Concern   Not on file  Social History Narrative   Not on file   Social Determinants of Health   Financial Resource Strain: Not on file  Food Insecurity: Not on file  Transportation Needs: Not on file  Physical Activity: Not on file  Stress: Not on file  Social Connections: Not on file  Intimate Partner Violence: Not on file    Review of Systems  Cardiovascular:  Negative for chest pain, dyspnea on  exertion, leg swelling, near-syncope, orthopnea, palpitations, paroxysmal nocturnal dyspnea and syncope.  Respiratory:  Negative for shortness of breath.       Objective:  Blood pressure 118/69, pulse 73, height 5\' 1"  (1.549 m), weight 121 lb (54.9 kg), SpO2 96 %. Body mass index is 22.86 kg/m.    Physical Exam Vitals reviewed.  Constitutional:      Appearance: She is well-developed.  HENT:     Head: Normocephalic and atraumatic.  Cardiovascular:     Rate and Rhythm: Normal rate and regular rhythm.     Pulses: Intact distal pulses.     Heart sounds: Murmur heard.     High-pitched blowing decrescendo early diastolic murmur is present with a grade of 2/4 at the upper right sternal border radiating to the apex.  Pulmonary:     Effort: Pulmonary effort is normal. No accessory muscle usage or respiratory distress.     Breath sounds: Normal breath sounds.  Abdominal:     General: Bowel sounds are normal.     Palpations: Abdomen is soft.  Musculoskeletal:        General: Normal range of motion.     Cervical back: Normal range of motion.  Skin:    General: Skin is warm and dry.  Neurological:     Mental Status: She is alert and oriented to person, place, and time.    Laboratory examination:  External Labs: 05/29/2018: RBC 3.92, normal H&H, CBC otherwise normal.  Creatinine 0.92, EGFR 60/73, potassium 4.1, CMP normal.  TSH 2.68.  Cholesterol 253, triglycerides 134, HDL 105, LDL 121.  Collected: June 26, 2019 Creatinine 0.88 mg/dL. Lipid profile: Total cholesterol 226, triglycerides 70, HDL 101, LDL 113 TSH: 2.48   Comp Metabolic Panel   1610-96-04    Glucose 89   70-99  BUN 26   6-26  Creatinine 0.99   0.60-1.30  [id number] 60   >60  Sodium 139   136-145  Potassium 4.2   3.5-5.5  Chloride 101   98-107  CO2 27   22-32  Anion Gap 15.9   6.0-20.0  Calcium 10.0   8.6-10.3  CA-corrected 9.62   8.60-10.30  Protein, Total 6.9   6.0-8.3  Albumin 4.4   3.4-4.8  TBIL 0.5   0.3-1.0   ALP 65   38-126  AST 20   0-39  ALT 13   0-52  Lipid Panel w/reflex   2021-05-18    Cholesterol 259   <200  CHOL/HDL 2.3   2.0-4.0  HDLD 114   30-85  Triglyceride 106   0-199  NHDL 145   0-129  LDL Chol Calc (NIH) 127   5-40  LDL Chol Calc (NIH) 127   9-81     Cardiac Studies:   EKG: 07/20/2022: Sinus rhythm, 66 bpm, left axis, without underlying injury pattern.  Echocardiogram: 12/12/2018: Left ventricle cavity is normal  in size. Normal left ventricular wall thickness. Normal LV systolic function with EF 67%. Normal global wall motion. Indeterminate diastolic filling pattern. Left atrial cavity is normal in size. 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 Trileaflet aortic valve. Moderate (Grade III) aortic regurgitation. Mild tricuspid regurgitation. Estimated pulmonary artery systolic pressure is 25 mmHg.  06/27/2019: LVEF 61%, grade 1 diastolic impairment, mildly dilated proximal ascending aorta at 3.7 cm, moderate AR, mild TR, RVSP 30 mmHg, right atrial pressure estimated to be 10-15 mmHg.  05/26/2020: LVEF 67%, grade 1 diastolic impairment, normal LAP, trileaflet aortic valve, moderate grade 3 AR, mild TR, no pulmonary hypertension.  07/14/2021: LVEF 50-55%, grade 1 diastolic impairment, moderate (grade 3) aortic, mild to moderate TR, mild PR, no pulm hypertension.  No significant change compared to prior studies.  09/28/2022:  Normal LV systolic function with visual EF 60-65%. Left ventricle cavity is normal in size. Normal left ventricular wall thickness. Normal global  wall motion. Normal diastolic filling pattern, normal LAP.  Moderate (Grade III) aortic regurgitation.  Mild tricuspid regurgitation. No evidence of pulmonary hypertension.  The aortic root (35mm) and proximal ascending aorta (36mm) measure within normal limits.  Compared to 07/14/2021 Grade 1 diastolic dysfunction is now normal, AR remains same, otherwise no  significant change.   Stress test: 07/04/2019: Myocardial perfusion is normal, LVEF by gated SPECT 55%.  Low risk study  Coronary calcium score 08/14/2021 1. No coronary atherosclerotic calcifications. The observed calcium score of 0 is at the zeroth percentile for subjects of the same age gender, and race/ethnicity who are free of clinical cardiovascular disease and treated diabetes. 2. Fusiform ascending thoracic aortic aneurysm measuring up to 4.2 cm. Recommend annual imaging followup by CTA or MRA. This recommendation follows 2010 ACCF/AHA/AATS/ACR/ASA/SCA/SCAI/SIR/STS/SVM Guidelines for the Diagnosis and Management of Patients with Thoracic Aortic Disease. Circulation. 2010; 121: Z610-R604. Aortic aneurysm NOS (ICD10-I71.9)  CT chest without contrast 08/15/2022: 1. Mild fusiform dilatation of the ascending aorta, maximal dimension 4.2 cm. This is unchanged from prior exam. Recommend annual imaging follow-up. 2. No acute intrathoracic abnormality. Aortic Atherosclerosis (ICD10-I70.0).   Assessment:     ICD-10-CM   1. Ascending aorta dilatation (HCC)  I77.810 MR CARDIAC MORPHOLOGY W WO CONTRAST    Lipid Panel With LDL/HDL Ratio    LDL cholesterol, direct    CMP14+EGFR    2. Moderate aortic regurgitation  I35.1 MR CARDIAC MORPHOLOGY W WO CONTRAST    3. Essential hypertension  I10     4. Mixed hyperlipidemia  E78.2 rosuvastatin (CRESTOR) 10 MG tablet    5. Aortic atherosclerosis (HCC)  I70.0 rosuvastatin (CRESTOR) 10 MG tablet    Lipid Panel With LDL/HDL Ratio    LDL cholesterol, direct    CMP14+EGFR       Recommendations:   LARRA CRUNKLETON is a 72 y.o. female whose past medical history and cardiac risk factors include: Ascending aortic aneurysm (42 mm, 07/2022), aortic atherosclerosis, hypertension, history of migraine headaches, osteoarthritis, moderate AR, postmenopausal female, advanced age.    Ascending aorta dilatation (HCC) Coronary calcium score noted ascending  thoracic aorta to be 42 mm (2023).  CT of the chest without contrast illustrates ascending thoracic aorta to be 42 mm (2024) Annual follow-up CT scheduled as recommended. Reemphasized importance of blood pressure management  Moderate aortic regurgitation Asymptomatic. Most recent echocardiogram notes a stable grade 3 moderate AI based on echocardiography. However, we discussed both cardiac MRI or TEE for further evaluation of valvular heart disease.  The shared decision was to proceed with cardiac MRI in 6 months to evaluate the severity of aortic regurgitation, chamber quantification specifically LV cavity, and to reevaluate the ascending aorta.  Of note, she has bilateral shoulder replacements that were done 3 to 4 years ago and has hardware in the right lower foot that was placed about 10 to 12 years ago.  If there are any limitations the cardiac MRI we may consider TEE as an alternative.  She will need a CBC prior to her cardiac MRI.  Will order closer to her testing  Essential hypertension Office blood pressures are well-controlled. Home blood pressures are better controlled per patient. Continue current medical therapy  Mixed hyperlipidemia Aortic atherosclerosis Calculated LDL 127, as of January 2023. In the past has been reluctant with medical therapy. Coronary calcium score is 0. CT of the chest without contrast notes aortic atherosclerosis. We discussed both pharmacological therapy versus conservative management and the shared decision was to proceed with medical therapy. Start rosuvastatin 10 mg p.o. nightly for repeat labs in 6 weeks to reevaluate therapy.  Orders Placed This Encounter  Procedures   MR CARDIAC MORPHOLOGY W WO CONTRAST   Lipid Panel With LDL/HDL Ratio   LDL cholesterol, direct   [id number]   --Continue cardiac medications as reconciled in final medication list. --Return in about 6 months (around 04/28/2023) for Follow up AR, Ao dilatation, cMR. Or sooner if  needed. --Continue follow-up with your primary care physician regarding the management of your other chronic comorbid conditions.  This note was created using a voice recognition software as a result there may be grammatical errors inadvertently enclosed that do not reflect the nature of this encounter. Every attempt is made to correct such errors.  Summer Cooper, [place], [facility]  Pager:  [phone] Office: [phone]

## 2022-10-20 ENCOUNTER — Encounter: Payer: Self-pay | Admitting: Cardiology

## 2022-12-16 ENCOUNTER — Ambulatory Visit: Payer: Medicare Other | Admitting: Podiatry

## 2022-12-21 ENCOUNTER — Ambulatory Visit: Payer: Medicare Other | Admitting: Podiatry

## 2022-12-21 DIAGNOSIS — M2042 Other hammer toe(s) (acquired), left foot: Secondary | ICD-10-CM | POA: Diagnosis not present

## 2022-12-21 DIAGNOSIS — M2012 Hallux valgus (acquired), left foot: Secondary | ICD-10-CM

## 2022-12-21 DIAGNOSIS — M19072 Primary osteoarthritis, left ankle and foot: Secondary | ICD-10-CM | POA: Diagnosis not present

## 2022-12-21 NOTE — Progress Notes (Signed)
  Subjective:  Patient ID: Summer Cooper, female    DOB: May 28, 1950,  MRN: [id number]  Chief Complaint  Patient presents with   Foot Pain    Pt present today for pre op consultation.    72 y.o. female presents with the above complaint. History confirmed with patient.  Her scheduled surgery is in 2 weeks she is here for final questions and review of the procedure and recovery process.  She also has some pain on the outside of the midfoot she would like to have me evaluate as well.  Objective:  Physical Exam: warm, good capillary refill, no trophic changes or ulcerative lesions, normal DP and PT pulses, normal sensory exam, and right foot has a well-healed first interspace Lapidus bunionectomy scar, some limited but pain-free range of motion of MTPJ, limited motion of the second MTPJ, third toe has some dorsiflexion and adductus contracture.  The left foot she has a large bunion deformity with severe hallux valgus, frontal plane rotation of the hallux, large dorsal medial eminence that is tender, hypermobility of the first ray noted.  Lesser digital hammertoes that are semireducible in nature.   Radiographs: Multiple views x-ray of both feet: Right foot has previous Lapidus bunionectomy with 2 screw fixation and Weil osteotomy of the second, overall good correction noted there are degenerative changes in the first and second MTPJ noted on the left foot she has a large hallux valgus deformity from plane rotation, elongated second and third rays, digital contractures and deviation of the sesamoid complex Assessment:   1. Acquired hallux valgus of left foot   2. Hammertoe of left foot   3. Arthritis of left midfoot      Plan:  Patient was evaluated and treated and all questions answered.  Today we again reviewed the [facility] surgical procedures and rationale.  All questions were addressed.  Surgery scheduled for 2 weeks from now.  She indicates she still would like to proceed at this point.  I  also recommend we modify her procedure by adding the addition of bone graft from the left heel to facilitate bony healing in the first TMT fusion site, she is also dealing with early arthritic changes in the midfoot and pain over the fourth TMT.  We will plan for an intra-articular injection under fluoroscopy during surgery.  No follow-ups on file.

## 2022-12-24 ENCOUNTER — Telehealth: Payer: Self-pay | Admitting: Urology

## 2022-12-24 NOTE — Telephone Encounter (Signed)
DOS - 01/07/23  CORRECTION BUNION BY PHALANX OSTEO LEFT --- 22025 HAMMER TOE REPAIR LEFT --- 42706 METATARSAL OSTEOTOMY LEFT --- 23762 ARTHRODESIS LIS FRAC LEFT --- 83151 JOINT INJECTION LEFT --- 20600 BONE REMOVAL FOR GRAFT --- 20900  [facility]   PER [facility] FOR CPT CODES 76160, 73710, 28308, [phone], 20600 AND 20900 HAVE BEEN APPROVED, AUTH # [id number], GOOD FROM 01/07/23 - 04/07/23.

## 2023-01-07 ENCOUNTER — Other Ambulatory Visit: Payer: Self-pay | Admitting: Podiatry

## 2023-01-07 DIAGNOSIS — M7742 Metatarsalgia, left foot: Secondary | ICD-10-CM | POA: Diagnosis not present

## 2023-01-07 DIAGNOSIS — M7752 Other enthesopathy of left foot: Secondary | ICD-10-CM | POA: Diagnosis not present

## 2023-01-07 DIAGNOSIS — M2012 Hallux valgus (acquired), left foot: Secondary | ICD-10-CM | POA: Diagnosis not present

## 2023-01-07 DIAGNOSIS — M2042 Other hammer toe(s) (acquired), left foot: Secondary | ICD-10-CM | POA: Diagnosis not present

## 2023-01-07 MED ORDER — GABAPENTIN 300 MG PO CAPS: 300.0000 mg | ORAL_CAPSULE | Freq: Three times a day (TID) | ORAL | 0 refills | Status: DC

## 2023-01-07 MED ORDER — ACETAMINOPHEN 500 MG PO TABS: 1000.0000 mg | ORAL_TABLET | Freq: Four times a day (QID) | ORAL | 0 refills | Status: AC | PRN

## 2023-01-07 MED ORDER — OXYCODONE HCL 5 MG PO TABS: 5.0000 mg | ORAL_TABLET | ORAL | 0 refills | Status: AC | PRN

## 2023-01-07 MED ORDER — IBUPROFEN 600 MG PO TABS: 600.0000 mg | ORAL_TABLET | Freq: Four times a day (QID) | ORAL | 0 refills | Status: AC | PRN

## 2023-01-11 ENCOUNTER — Other Ambulatory Visit (HOSPITAL_COMMUNITY): Payer: Self-pay

## 2023-01-13 ENCOUNTER — Ambulatory Visit (INDEPENDENT_AMBULATORY_CARE_PROVIDER_SITE_OTHER): Payer: Medicare Other

## 2023-01-13 ENCOUNTER — Encounter: Payer: Self-pay | Admitting: Podiatry

## 2023-01-13 ENCOUNTER — Ambulatory Visit (INDEPENDENT_AMBULATORY_CARE_PROVIDER_SITE_OTHER): Payer: Medicare Other | Admitting: Podiatry

## 2023-01-13 VITALS — BP 120/74 | HR 88 | Temp 98.6°F | Resp 16 | Ht 61.0 in | Wt 121.0 lb

## 2023-01-13 DIAGNOSIS — M2012 Hallux valgus (acquired), left foot: Secondary | ICD-10-CM

## 2023-01-13 DIAGNOSIS — S92325A Nondisplaced fracture of second metatarsal bone, left foot, initial encounter for closed fracture: Secondary | ICD-10-CM

## 2023-01-13 DIAGNOSIS — Z9889 Other specified postprocedural states: Secondary | ICD-10-CM | POA: Diagnosis not present

## 2023-01-13 DIAGNOSIS — M2042 Other hammer toe(s) (acquired), left foot: Secondary | ICD-10-CM

## 2023-01-13 NOTE — Progress Notes (Signed)
Subjective:  Patient ID: Summer Cooper, female    DOB: Sep 12, 1950,  MRN: [id number]  Chief Complaint  Patient presents with   Routine Post Op    POV # 1 DOS 01/07/23 --- LEFT FOOT BUNION CORRECTION, SHORTENING 2ND METAND 2ND HAMMERTOE CORRECTION      72 y.o. female returns for post-op check.  She returns for follow-up.  Pain is controlled.  Denies nausea chills fevers vomiting shortness of breath or pain.  She has been using the knee scooter to take pressure off of it  Review of Systems: Negative except as noted in the HPI. Denies N/V/F/Ch.   Objective:   Vitals:   01/13/23 1041  BP: 120/74  Pulse: 88  Resp: 16  Temp: 98.6 F (37 C)  SpO2: 97%   Body mass index is 22.86 kg/m. Constitutional Well developed. Well nourished.  Vascular Foot warm and well perfused. Capillary refill normal to all digits.  Calf is soft and supple, no posterior calf or knee pain, negative Homans' sign  Neurologic Normal speech. Oriented to person, place, and time. Epicritic sensation to light touch grossly present bilaterally.  Dermatologic Skin healing well without signs of infection. Skin edges well coapted without signs of infection.  Orthopedic: Tenderness to palpation noted about the surgical site.   Multiple view plain film radiographs: Correction of bunionectomy is noted with pinning of fracture site intact and hammertoe correction, no further complication of hardware positional fracture sites Assessment:   1. Closed nondisplaced fracture of second metatarsal bone of left foot, initial encounter   2. Acquired hallux valgus of left foot   3. Hammertoe of left foot    Plan:  Patient was evaluated and treated and all questions answered.  S/p foot surgery left -Progressing as expected post-operatively.  We reviewed the results of the procedure and the intra-operative fracture that occurred of the second metatarsal.  We discussed possible causes of this, she has had a bone density test  that did not show any osteopenia or osteoporosis.  I discussed with her due to the presence of the fracture she would not be able to weight-bear early will be at least 6 weeks before the pin is pulled and gradual weightbearing can begin -WB Status: NWB in cam walker boot with knee scooter -Sutures: Return in 2 weeks to remove. -Medications: No refills required.  Advised if the pain increases after finishing the gabapentin to let me know for a refill -Foot redressed.  Continue ice and elevation.  Maintain dressing until next visit  Return in about 2 weeks (around 01/27/2023) for post op (no x-rays), suture removal.

## 2023-01-15 ENCOUNTER — Encounter: Payer: Self-pay | Admitting: Podiatry

## 2023-01-15 MED ORDER — GABAPENTIN 300 MG PO CAPS: 300.0000 mg | ORAL_CAPSULE | Freq: Three times a day (TID) | ORAL | 0 refills | Status: DC

## 2023-01-27 ENCOUNTER — Encounter: Payer: Self-pay | Admitting: Podiatry

## 2023-01-27 ENCOUNTER — Ambulatory Visit: Payer: Medicare Other | Admitting: Podiatry

## 2023-01-27 DIAGNOSIS — M2042 Other hammer toe(s) (acquired), left foot: Secondary | ICD-10-CM

## 2023-01-27 DIAGNOSIS — M2012 Hallux valgus (acquired), left foot: Secondary | ICD-10-CM

## 2023-01-27 DIAGNOSIS — S92325A Nondisplaced fracture of second metatarsal bone, left foot, initial encounter for closed fracture: Secondary | ICD-10-CM

## 2023-01-27 NOTE — Progress Notes (Signed)
  Subjective:  Patient ID: Summer Cooper, female    DOB: [birth year],  MRN: [id number]  Chief Complaint  Patient presents with   Routine Post Op    Left foot s/p Lapiplasty, 2nd met fracuture with pin fixation     72 y.o. female returns for post-op check.  She returns for follow-up.  Pain continues to improve  Review of Systems: Negative except as noted in the HPI. Denies N/V/F/Ch.   Objective:   There were no vitals filed for this visit.  There is no height or weight on file to calculate BMI. Constitutional Well developed. Well nourished.  Vascular Foot warm and well perfused. Capillary refill normal to all digits.  Calf is soft and supple, no posterior calf or knee pain, negative Homans' sign  Neurologic Normal speech. Oriented to person, place, and time. Epicritic sensation to light touch grossly present bilaterally.  Dermatologic Incisions well-healed and not hypertrophic  Orthopedic: Tenderness to palpation noted about the surgical site.   Multiple view plain film radiographs: Correction of bunionectomy is noted with pinning of fracture site intact and hammertoe correction, no further complication of hardware positional fracture sites Assessment:   1. Closed nondisplaced fracture of second metatarsal bone of left foot, initial encounter   2. Acquired hallux valgus of left foot   3. Hammertoe of left foot    Plan:  Patient was evaluated and treated and all questions answered.  S/p foot surgery left -Sutures removed uneventfully.  Steri-Strips applied.  She may wash her foot now apply Neosporin around the pin site before and after.  Return in 3 weeks for new radiographs and removal of the pin should be able to transition to gradual weightbearing if things are healing well  Return in about 3 weeks (around 02/17/2023) for post op (new x-rays).

## 2023-02-17 ENCOUNTER — Ambulatory Visit (INDEPENDENT_AMBULATORY_CARE_PROVIDER_SITE_OTHER): Payer: Medicare Other | Admitting: Podiatry

## 2023-02-17 ENCOUNTER — Ambulatory Visit (INDEPENDENT_AMBULATORY_CARE_PROVIDER_SITE_OTHER): Payer: Medicare Other

## 2023-02-17 ENCOUNTER — Encounter: Payer: Self-pay | Admitting: Podiatry

## 2023-02-17 DIAGNOSIS — M2042 Other hammer toe(s) (acquired), left foot: Secondary | ICD-10-CM

## 2023-02-17 DIAGNOSIS — M2012 Hallux valgus (acquired), left foot: Secondary | ICD-10-CM

## 2023-02-17 DIAGNOSIS — Z9889 Other specified postprocedural states: Secondary | ICD-10-CM

## 2023-02-17 DIAGNOSIS — S92325D Nondisplaced fracture of second metatarsal bone, left foot, subsequent encounter for fracture with routine healing: Secondary | ICD-10-CM | POA: Diagnosis not present

## 2023-02-17 NOTE — Progress Notes (Signed)
  Subjective:  Patient ID: Summer Cooper, female    DOB: [birth year],  MRN: [id number]  Chief Complaint  Patient presents with   Routine Post Op    POV # 3 DOS 01/07/23 --- LEFT FOOT BUNION CORRECTION, SHORTENING 2ND METAND 2ND HAMMERTOE CORRECTION   "Its okay, just ready to get that pin out"     72 y.o. female returns for post-op check.  She returns for follow-up.  Still not having any pain  Review of Systems: Negative except as noted in the HPI. Denies N/V/F/Ch.   Objective:   There were no vitals filed for this visit.  There is no height or weight on file to calculate BMI. Constitutional Well developed. Well nourished.  Vascular Foot warm and well perfused. Capillary refill normal to all digits.  Calf is soft and supple, no posterior calf or knee pain, negative Homans' sign  Neurologic Normal speech. Oriented to person, place, and time. Epicritic sensation to light touch grossly present bilaterally.  Dermatologic Incisions well-healed and not hypertrophic  Orthopedic: No pain to palpation.  Moderate forefoot edema   Multiple view plain film radiographs: Correction of bunionectomy is maintained, there is good early bridging across second metatarsal fracture, early bridging across fusion site of first TMT J Assessment:   1. Acquired hallux valgus of left foot   2. Hammertoe of left foot   3. Status post left foot surgery   4. Closed nondisplaced fracture of second metatarsal bone of left foot with routine healing, subsequent encounter    Plan:  Patient was evaluated and treated and all questions answered.  S/p foot surgery left -Kirschner wire removed uneventfully.  She may begin range of motion of the second MTP joint.  She may begin gradual weightbearing to the heel in the cam walker boot.  May still need knee scooter for longer distances.  I will see her back in 4 weeks for new x-rays and likely transition to a surgical shoe and full weightbearing.  Return in about 4  weeks (around 03/17/2023) for post op (new x-rays).

## 2023-03-17 ENCOUNTER — Ambulatory Visit: Payer: Medicare Other | Admitting: Podiatry

## 2023-03-17 ENCOUNTER — Ambulatory Visit (INDEPENDENT_AMBULATORY_CARE_PROVIDER_SITE_OTHER): Payer: Medicare Other

## 2023-03-17 ENCOUNTER — Telehealth: Payer: Self-pay

## 2023-03-17 DIAGNOSIS — M2042 Other hammer toe(s) (acquired), left foot: Secondary | ICD-10-CM

## 2023-03-17 DIAGNOSIS — M2012 Hallux valgus (acquired), left foot: Secondary | ICD-10-CM | POA: Diagnosis not present

## 2023-03-17 DIAGNOSIS — S92325D Nondisplaced fracture of second metatarsal bone, left foot, subsequent encounter for fracture with routine healing: Secondary | ICD-10-CM

## 2023-03-17 NOTE — Progress Notes (Signed)
  Subjective:  Patient ID: Summer Cooper, female    DOB: [birth year],  MRN: [id number]  Chief Complaint  Patient presents with   Routine Post Op    POV #1:Acquired hallux valgus of left foot        72 y.o. female returns for post-op check.  She returns for follow-up.  Still not having any pain  Review of Systems: Negative except as noted in the HPI. Denies N/V/F/Ch.   Objective:   There were no vitals filed for this visit.  There is no height or weight on file to calculate BMI. Constitutional Well developed. Well nourished.  Vascular Foot warm and well perfused. Capillary refill normal to all digits.  Calf is soft and supple, no posterior calf or knee pain, negative Homans' sign  Neurologic Normal speech. Oriented to person, place, and time. Epicritic sensation to light touch grossly present bilaterally.  Dermatologic Incisions well-healed and not hypertrophic  Orthopedic: No pain to palpation.  Moderate forefoot edema.  Second toe has some dorsiflexion   Multiple view plain film radiographs: Correction of bunionectomy is maintained, good fusion across first TMT and second metatarsal fracture and there is some residual hallux abductus Assessment:   1. Acquired hallux valgus of left foot   2. Hammertoe of left foot   3. Closed nondisplaced fracture of second metatarsal bone of left foot with routine healing, subsequent encounter    Plan:  Patient was evaluated and treated and all questions answered.  S/p foot surgery left -Showing good healing across her fracture and fusion sites.  Should be able to transition from the cam boot to a supportive shoe at this point.  I did recommend physical therapy to work on range of motion of the toe she has some dorsiflexion contracture with scar tissue.  She should also begin active and passive range of motion at home of the toes.  Referral sent to [facility] in [facility]  Follow-up in 1 month for new radiographs  Return in about 1  month (around 04/16/2023) for post op (new x-rays).

## 2023-03-17 NOTE — Telephone Encounter (Signed)
PT referral, demographics and office note faxed to [facility] @[place]

## 2023-03-17 NOTE — Telephone Encounter (Signed)
-----   Message from Edwin Cap sent at 03/17/2023 12:56 PM EST ----- Can you send this referral for [facility] in [facility]?  Thanks

## 2023-03-18 ENCOUNTER — Telehealth (HOSPITAL_COMMUNITY): Payer: Self-pay | Admitting: *Deleted

## 2023-03-18 ENCOUNTER — Encounter (HOSPITAL_COMMUNITY): Payer: Self-pay

## 2023-03-18 ENCOUNTER — Telehealth: Payer: Self-pay

## 2023-03-18 NOTE — Telephone Encounter (Signed)
Referral, office note and demographics faxed to [facility].  Confirmation received

## 2023-03-18 NOTE — Telephone Encounter (Signed)
Patient returning call about her upcoming cardiac imaging study; pt verbalizes understanding of appt date/time, parking situation and where to check in; name and call back number provided for further questions should they arise  Larey Brick RN Navigator Cardiac Imaging [facility] [phone] office [phone] cell  Patient denies claustrophobia but reports a screw in her foot.

## 2023-03-18 NOTE — Telephone Encounter (Signed)
Attempted to call patient regarding upcoming cardiac MRI appointment. Left message on voicemail with name and callback number  Larey Brick RN Navigator Cardiac Imaging [facility] [phone] Office [phone] Cell

## 2023-03-18 NOTE — Telephone Encounter (Signed)
-----  Message from Edwin Cap sent at 03/17/2023 12:56 PM EST ----- Can you send this referral for [facility] in [place]?  Thanks

## 2023-03-21 ENCOUNTER — Other Ambulatory Visit: Payer: Self-pay | Admitting: Cardiology

## 2023-03-21 ENCOUNTER — Ambulatory Visit (HOSPITAL_COMMUNITY)
Admission: RE | Admit: 2023-03-21 | Discharge: 2023-03-21 | Disposition: A | Discharge status: Home / Self Care | Payer: Medicare Other | Source: Ambulatory Visit | Attending: Cardiology | Admitting: Cardiology

## 2023-03-21 DIAGNOSIS — I77810 Thoracic aortic ectasia: Secondary | ICD-10-CM

## 2023-03-21 DIAGNOSIS — I351 Nonrheumatic aortic (valve) insufficiency: Secondary | ICD-10-CM

## 2023-03-21 MED ORDER — GADOBUTROL 1 MMOL/ML IV SOLN
9.0000 mL | Freq: Once | INTRAVENOUS | Status: AC | PRN
  Administered 2023-03-21: 9 mL via INTRAVENOUS

## 2023-04-05 ENCOUNTER — Other Ambulatory Visit (HOSPITAL_COMMUNITY): Payer: Self-pay

## 2023-04-05 MED ORDER — COVID-19 MRNA VAC-TRIS(PFIZER) 30 MCG/0.3ML IM SUSY
0.3000 mL | PREFILLED_SYRINGE | Freq: Once | INTRAMUSCULAR | 0 refills | Status: AC
  Filled 2023-04-05: qty 0.3, 1d supply, fill #0

## 2023-04-28 ENCOUNTER — Encounter: Payer: Self-pay | Admitting: Podiatry

## 2023-04-28 ENCOUNTER — Ambulatory Visit (INDEPENDENT_AMBULATORY_CARE_PROVIDER_SITE_OTHER): Payer: Medicare Other

## 2023-04-28 ENCOUNTER — Ambulatory Visit: Payer: Medicare Other | Attending: Cardiology | Admitting: Cardiology

## 2023-04-28 ENCOUNTER — Encounter: Payer: Self-pay | Admitting: Cardiology

## 2023-04-28 ENCOUNTER — Ambulatory Visit: Payer: Medicare Other | Admitting: Podiatry

## 2023-04-28 VITALS — BP 130/78 | HR 74 | Resp 16 | Ht 61.0 in | Wt 123.4 lb

## 2023-04-28 DIAGNOSIS — E782 Mixed hyperlipidemia: Secondary | ICD-10-CM | POA: Diagnosis not present

## 2023-04-28 DIAGNOSIS — M2012 Hallux valgus (acquired), left foot: Secondary | ICD-10-CM

## 2023-04-28 DIAGNOSIS — I77810 Thoracic aortic ectasia: Secondary | ICD-10-CM

## 2023-04-28 DIAGNOSIS — M2042 Other hammer toe(s) (acquired), left foot: Secondary | ICD-10-CM | POA: Diagnosis not present

## 2023-04-28 DIAGNOSIS — I351 Nonrheumatic aortic (valve) insufficiency: Secondary | ICD-10-CM

## 2023-04-28 DIAGNOSIS — I700 Atherosclerosis of aorta: Secondary | ICD-10-CM

## 2023-04-28 DIAGNOSIS — I10 Essential (primary) hypertension: Secondary | ICD-10-CM | POA: Diagnosis not present

## 2023-04-28 DIAGNOSIS — S92325D Nondisplaced fracture of second metatarsal bone, left foot, subsequent encounter for fracture with routine healing: Secondary | ICD-10-CM | POA: Diagnosis not present

## 2023-04-28 NOTE — Progress Notes (Signed)
  Subjective:  Patient ID: Summer Cooper, female    DOB: Mar 18, 1951,  MRN: [id number]  Chief Complaint  Patient presents with   Routine Post Op    POV #2:Acquired hallux valgus of left foot       73 y.o. female returns for post-op check.  She returns for follow-up she did go to therapy for a session, they felt like the scar tissue had largely set and at that point and there was not much else of therapist could do with a to give her some exercises to work on.    Review of Systems: Negative except as noted in the HPI. Denies N/V/F/Ch.   Objective:   There were no vitals filed for this visit.  There is no height or weight on file to calculate BMI. Constitutional Well developed. Well nourished.  Vascular Foot warm and well perfused. Capillary refill normal to all digits.  Calf is soft and supple, no posterior calf or knee pain, negative Homans' sign  Neurologic Normal speech. Oriented to person, place, and time. Epicritic sensation to light touch grossly present bilaterally.  Dermatologic Incisions well-healed and not hypertrophic  Orthopedic: No pain to palpation.  No edema.  Dorsiflexion of the second toe has improved but still higher than the third toe   Multiple view plain film radiographs: Correction of bunionectomy is maintained, good fusion across first TMT with the exception of a small plantar portion and second metatarsal fracture and there is some residual hallux abductus Assessment:   1. Acquired hallux valgus of left foot   2. Closed nondisplaced fracture of second metatarsal bone of left foot with routine healing, subsequent encounter   3. Hammertoe of left foot    Plan:  Patient was evaluated and treated and all questions answered.  S/p foot surgery left -Still showing good healing she is in regular shoe gear.  She should continue her range of motion exercises of the second toe, we discussed that if is not improving that flexor tenotomy of the digit may offer  some improved position.  She also has an ingrown toenail on the right foot she would like to have treated at some point, she we will follow-up with me in 3 months for new radiographs of the left foot and ingrown toenail removal or sooner if either these issues become more symptomatic.  Return in about 3 months (around 07/27/2023) for follow up from left foot surgery (new xrays) and R ingrown toenail removal .

## 2023-04-28 NOTE — Progress Notes (Signed)
 Cardiology Office Note:  .   Date:  04/28/2023  ID:  Summer Cooper, DOB 1950/05/16, MRN [id number] PCP:  Claudene Pellet, MD  Former Cardiology Providers: Emmalene Lawrence, APRN, FNP-C  [facility] Providers Cardiologist:  Madonna Large, DO , [facility] (established care March 2024) Electrophysiologist:  None  Click to update primary MD,subspecialty MD or APP then REFRESH:1}    Chief Complaint  Patient presents with   Moderate aortic regurgitation   Ascending aorta dilatation   Follow-up    History of Present Illness: .   Summer Cooper is a 73 y.o. Caucasian female whose past medical history and cardiovascular risk factors includes: Ascending aortic aneurysm (42 mm, 07/2022), aortic atherosclerosis, hypertension, history of migraine headaches, osteoarthritis, moderate AR, postmenopausal female, advanced age.   She works as a engineer, water. Previously worked as a engineer, civil (consulting) for [employer].    Patient is being followed by the practice for aortic regurgitation & ascending aortic aneurysm.   During her coronary calcium  score patient was noted to have an ascending aortic dilatation of 42mm.  And follow-up echocardiograms noted aortic regurgitation to be at least moderate in severity.  At the last office visit we discussed the role of transesophageal echocardiogram to further evaluate the severity of aortic regurgitation and also to better evaluate the ascending aorta.  However she wanted to hold off on invasive procedures and a shared decision was to proceed with cardiac MRI.  She had a cardiac MRI in November 2024 which noted ascending aorta to be 40 mm in dimension and the severity of AI was reported to be mild.  Clinically she has done well from a cardiovascular standpoint since last office visit.  She is tolerated atorvastatin 10 mg p.o. daily well and her lipids have improved.  Outside labs from September 2024 independently reviewed and noted below for further reference.  She also  underwent surgery of her left foot recovery has been slow.  She was nonweightbearing for short period of time and also had poor eating habits at that time.  Patient states that is the new year and she will follow a better diet and hopefully improve her lipids.  Review of Systems: .   Review of Systems  Cardiovascular:  Negative for chest pain, claudication, irregular heartbeat, leg swelling, near-syncope, orthopnea, palpitations, paroxysmal nocturnal dyspnea and syncope.  Respiratory:  Negative for shortness of breath.   Hematologic/Lymphatic: Negative for bleeding problem.    Studies Reviewed:   EKG: EKG Interpretation Date/Time:  Thursday April 28 2023 11:30:04 EST Ventricular Rate:  68 PR Interval:  128 QRS Duration:  98 QT Interval:  402 QTC Calculation: 427 R Axis:   -21  Text Interpretation: Normal sinus rhythm Possible Anterior infarct , age undetermined When compared with ECG of 27-May-2017 05:54, Nonspecific T wave abnormality now evident in Anterior leads Confirmed by Large Madonna [phone]) on 04/28/2023 11:37:27 AM  Echocardiogram: 09/28/2022:  Normal LV systolic function with visual EF 60-65%. Left ventricle cavity is normal in size. Normal left ventricular wall thickness. Normal global  wall motion. Normal diastolic filling pattern, normal LAP.  Moderate (Grade III) aortic regurgitation.  Mild tricuspid regurgitation. No evidence of pulmonary hypertension.  The aortic root (35mm) and proximal ascending aorta (36mm) measure within normal limits.  Compared to 07/14/2021 Grade 1 diastolic dysfunction is now normal, AR remains same, otherwise no significant change.   Stress Testing: 07/04/2019: Myocardial perfusion is normal, LVEF by gated SPECT 55%.  Low risk study  Coronary calcium  score  08/14/2021 1. No coronary atherosclerotic calcifications. The observed calcium  score of 0  2. Fusiform ascending thoracic aortic aneurysm measuring up to 4.2 cm.  See report for  additional details  Cardiac MRI November 2024: 1.  Dilated ascending thoracic aorta 4.0 cm   2. Central AR mild by RF only 6% and vena contracta width in LVOT only 25% RV < 10 cc   3.  Normal LV size and function LVEF 57% ESV 60 cc   4.  Normal RV size and function RVEF 57%   5.  No delayed gadolinium enhancement   6.  Normal parametric measures see values above   7.  Estimated cardiac output 5.5 L/min  RADIOLOGY: CT chest without contrast 08/15/2022: 1. Mild fusiform dilatation of the ascending aorta, maximal dimension 4.2 cm. This is unchanged from prior exam. Recommend annual imaging follow-up. 2. No acute intrathoracic abnormality. Aortic Atherosclerosis (ICD10-I70.0).  Risk Assessment/Calculations:   N/A   Labs:    External Labs: 05/29/2018: Cholesterol 253, triglycerides 134, HDL 105, LDL 121. June 26, 2019: Total cholesterol 226, triglycerides 70, HDL 101, LDL 113  Lipid Panel w/reflex   2021-05-18    Cholesterol 259   <200  CHOL/HDL 2.3   2.0-4.0  HDLD 114   30-85  Triglyceride 106   0-199  NHDL 145   0-129  LDL Chol Calc (NIH) 127   9-00  LDL Chol Calc (NIH) 127   9-00   Thyroid Diagnostic [facility] Reviewed date:12/29/2022 08:11:10 AM Interpretation:Normal range Performing Lab: Notes/Report: Testing Performed at: [facility], [address], [place], [place] [address]  TSH 2.97 0.34-4.50 UlU/mL    Lipid Panel w/reflex Reviewed date:12/29/2022 08:11:10 AM Interpretation:LDL 84 Performing Lab: Notes/Report: Testing Performed at: [facility], [address], [place], [place] [address]  Cholesterol 191 <200 mg/dL    CHOL/HDL 2.2 7.9-5.9 Ratio    HDLD 88 30-85 mg/dL Values below 40 mg/dL indicate increased risk factor  Triglyceride 115 0-199 mg/dL    NHDL 896 9-870 mg/dL Range dependent upon risk factors.  LDL Chol Calc (NIH) 84 0-99 mg/dL    Comp Metabolic Panel Reviewed date:12/29/2022 08:11:10 AM Interpretation:GFR  61 Performing Lab: Notes/Report: Testing Performed at: [facility], [address], [address], [place], [place] [address]  Glucose 91 70-99 mg/dL    BUN 34 3-73 mg/dL    Creatinine 9.02 9.39-8.69 mg/dl    [id number] 61 >39 calc In accordance with recommendations from NKF-ASN Task Force, [facility] has updated its eGFR calc to the 2021 CKD-EDI equation that estimates kidney function without a race variable;Stage 1 > 90 ML/Min plus Albuminuria;Stage 2 60-89 ML/MIN;Stage 3 30-59 ML/MIN;Stage 4 15-29 ML/MIN;Stage 5 <15 ML/MIN  Sodium 140 136-145 mmol/L    Potassium 4.9 3.5-5.5 mmol/L    Chloride 103 98-107 mmol/L    CO2 25 22-32 mmol/L    Anion Gap 17.0 6.0-20.0 mmol/L    Calcium  10.2 8.6-10.3 mg/dL    CA-corrected 0.23 1.39-89.69 mg/dL    Protein, Total 7.0 3.9-1.6 g/dL    Albumin 4.6 6.5-5.1 g/dL    TBIL 0.4 9.6-8.9 mg/dL    ALP 62 61-873 U/L    AST 24 0-39 U/L    ALT 15 0-52 U/L    CBC with Diff Reviewed date:12/29/2022 08:11:10 AM Interpretation:Normal Performing Lab: Notes/Report: Testing Performed at: [facility], [address], [place], [place] [address]  WBC 7.5 4.0-11.0 K/ul    RBC 3.90 4.20-5.40 M/uL    HGB 12.1 12.0-16.0 g/dL  HCT 37.1 37.0-47.0 %    MCV 95.2 81.0-99.0 fL    MCH 31.0 27.0-33.0 pg    MCHC 32.6 32.0-36.0 g/dL    RDW 86.4 88.4-84.4 %    PLT 343 150-400     Physical Exam:    Today's Vitals   04/28/23 1126  BP: 130/78  Pulse: 74  Resp: 16  SpO2: 95%  Weight: 123 lb 6.4 oz (56 kg)  Height: 5' 1 (1.549 m)   Body mass index is 23.32 kg/m. Wt Readings from Last 3 Encounters:  04/28/23 123 lb 6.4 oz (56 kg)  01/13/23 121 lb (54.9 kg)  10/19/22 121 lb (54.9 kg)    Physical Exam  Constitutional: No distress.  hemodynamically stable  Neck: No JVD present.  Cardiovascular: Normal rate, regular rhythm, S1 normal and S2 normal. Exam reveals no gallop, no S3 and no S4.  Murmur heard. High-pitched decrescendo early diastolic murmur is  present with a grade of 2/4 at the upper right sternal border radiating to the apex. Pulmonary/Chest: Effort normal and breath sounds normal. No stridor. She has no wheezes. She has no rales.  Abdominal: Soft. Bowel sounds are normal. She exhibits no distension. There is no abdominal tenderness.  Musculoskeletal:        General: No edema.     Cervical back: Neck supple.  Neurological: She is alert and oriented to person, place, and time. She has intact cranial nerves (2-12).  Skin: Skin is warm.   Impression & Recommendation(s):  Impression:   ICD-10-CM   1. Ascending aorta dilatation (HCC)  I77.810 EKG 12-Lead    CT Chest Wo Contrast    2. Moderate aortic regurgitation  I35.1 ECHOCARDIOGRAM COMPLETE    3. Essential hypertension  I10     4. Mixed hyperlipidemia  E78.2     5. Aortic atherosclerosis (HCC)  I70.0        Recommendation(s):  Ascending aorta dilatation (HCC) Moderate aortic regurgitation Most recent cardiac MRI in November 2024 noted ascending aorta to be 40 mm and the severity of AI is mild. Given the dimensions of the ascending aorta I recommended 1 year follow-up study.  Will schedule a CT of the chest without contrast in November 2025.  Will also proceed with an echocardiogram to reevaluate the severity of AI in November 2025.  I will see her back in the office either in December 2025/January 2026.  Reemphasized importance of blood pressure management.  Essential hypertension Office blood pressures are well-controlled at 130/78 on current medical therapy. Currently on hydrochlorothiazide  12.5 mg p.o. daily. Currently on lisinopril  5 mg p.o. daily.  Mixed hyperlipidemia Currently on rosuvastatin  10 mg p.o. daily.   She denies myalgia or other side effects. Most recent lipids dated December 29, 2022, independently reviewed as noted above. LDL levels have improved, now at 84 mg/dL when compared to prior studies  Orders Placed:  Orders Placed This Encounter   Procedures   CT Chest Wo Contrast    Standing Status:   Future    Expiration Date:   04/27/2024    Preferred imaging location?:   [facility]   EKG 12-Lead   ECHOCARDIOGRAM COMPLETE    Standing Status:   Future    Expected Date:   02/26/2024    Expiration Date:   04/27/2024    Where should this test be performed:   [facility])    Does the patient weigh less than or greater than 250 lbs?:   Patient weighs less  than 250 lbs    Perflutren DEFINITY (image enhancing agent) should be administered unless hypersensitivity or allergy exist:   Administer Perflutren    Reason for exam-Echo:   Other-Full Diagnosis List    Full ICD-10/Reason for Exam:   Aortic regurgitation [809152]   Discussed management of at least 2 chronic comorbid conditions, outside labs from September 2024 available in Care Everywhere reviewed independently, cardiac MRI results reviewed November 2024.,  Additional diagnostic testing ordered to follow disease progression.  Final Medication List:   No orders of the defined types were placed in this encounter.   There are no discontinued medications.   Current Outpatient Medications:    acetaminophen  (TYLENOL ) 500 MG tablet, Take 1,000 mg by mouth every 8 (eight) hours as needed for headache., Disp: , Rfl:    amoxicillin-clavulanate (AUGMENTIN) 875-125 MG tablet, Take 1 tablet by mouth 2 (two) times daily., Disp: , Rfl:    cetirizine (ZYRTEC) 10 MG tablet, Take 10 mg by mouth daily as needed for allergies., Disp: , Rfl:    diclofenac  (VOLTAREN ) 75 MG EC tablet, Take 75 mg by mouth daily. , Disp: , Rfl:    diclofenac  sodium (VOLTAREN ) 1 % GEL, Place 2 g onto the skin 4 (four) times daily as needed (pain). , Disp: , Rfl:    fluticasone  (FLONASE ) 50 MCG/ACT nasal spray, Place 2 sprays into both nostrils daily as needed for allergies or rhinitis., Disp: , Rfl:    hydrochlorothiazide  (MICROZIDE ) 12.5 MG capsule, Take 12.5 mg by mouth daily., Disp: , Rfl:     lisinopril  (PRINIVIL ,ZESTRIL ) 5 MG tablet, Take 5 mg by mouth daily., Disp: , Rfl:    Multiple Vitamins-Minerals (MULTI FOR HER 50+) TABS, Take 1 tablet by mouth See admin instructions., Disp: , Rfl:    ondansetron  (ZOFRAN ) 4 MG tablet, Take 4 mg by mouth every 8 (eight) hours as needed for nausea or vomiting., Disp: , Rfl:    polyethylene glycol powder (GLYCOLAX /MIRALAX ) 17 GM/SCOOP powder, See admin instructions., Disp: , Rfl:    SUMAtriptan  (IMITREX ) 50 MG tablet, Take 50 mg by mouth every 2 (two) hours as needed for migraine. , Disp: , Rfl:    traMADol  (ULTRAM ) 50 MG tablet, Take 50 mg by mouth every 6 (six) hours as needed., Disp: , Rfl:    rosuvastatin  (CRESTOR ) 10 MG tablet, Take 1 tablet (10 mg total) by mouth daily., Disp: 90 tablet, Rfl: 3  Consent:   N/A  Disposition:   December 2025/January 2026 after the completion of echocardiogram and CT chest without contrast. Patient may be asked to follow-up sooner based on the results of the above-mentioned testing.  Her questions and concerns were addressed to her satisfaction. She voices understanding of the recommendations provided during this encounter.    Signed, Madonna Large, DO, [facility]  [facility]  [address] [place], [place] [address] 04/28/2023 12:06 PM

## 2023-04-28 NOTE — Patient Instructions (Signed)
 Medication Instructions:  Your physician recommends that you continue on your current medications as directed. Please refer to the Current Medication list given to you today.  *If you need a refill on your cardiac medications before your next appointment, please call your pharmacy*  Lab Work: None ordered today. If you have labs (blood work) drawn today and your tests are completely normal, you will receive your results only by: MyChart Message (if you have MyChart) OR A paper copy in the mail If you have any lab test that is abnormal or we need to change your treatment, we will call you to review the results.  Testing/Procedures: Your physician has requested that you have an echocardiogram in November 2025. Echocardiography is a painless test that uses sound waves to create images of your heart. It provides your doctor with information about the size and shape of your heart and how well your heart's chambers and valves are working. This procedure takes approximately one hour. There are no restrictions for this procedure. Please do NOT wear cologne, perfume, aftershave, or lotions (deodorant is allowed). Please arrive 15 minutes prior to your appointment time.  Please note: We ask at that you not bring children with you during ultrasound (echo/ vascular) testing. Due to room size and safety concerns, children are not allowed in the ultrasound rooms during exams. Our front office staff cannot provide observation of children in our lobby area while testing is being conducted. An adult accompanying a patient to their appointment will only be allowed in the ultrasound room at the discretion of the ultrasound technician under special circumstances. We apologize for any inconvenience.  Your physician has requested you have a chest CT without contrast in November 2025, which is a noninvasive, special x-ray that produces cross-sectional images of the body using x-rays and a computer. CT scans help  physicians diagnose and treat medical conditions. For some CT exams, a contrast material is used to enhance visibility in the area of the body being studied. CT scans provide greater clarity and reveal more details than regular x-ray exams.   Follow-Up: At [facility], you and your health needs are our priority.  As part of our continuing mission to provide you with exceptional heart care, we have created designated Provider Care Teams.  These Care Teams include your primary Cardiologist (physician) and Advanced Practice Providers (APPs -  Physician Assistants and Nurse Practitioners) who all work together to provide you with the care you need, when you need it.   Your next appointment:   January 2026  The format for your next appointment:   In Person  Provider:   Madonna Large, DO {

## 2023-05-05 ENCOUNTER — Encounter: Payer: Self-pay | Admitting: Pediatrics

## 2023-05-11 ENCOUNTER — Encounter: Payer: Self-pay | Admitting: Pediatrics

## 2023-05-20 ENCOUNTER — Telehealth: Payer: Self-pay | Admitting: *Deleted

## 2023-05-20 NOTE — Telephone Encounter (Signed)
Dr. Doy Hutching,  Please see previous message and advise if you would prefer the patient have an OV prior to procedure or if they are able to be a dricet at the hospital? Please/Thank you Bre, PV RN

## 2023-05-20 NOTE — Telephone Encounter (Signed)
[facility] procedure cancelled, PV appt still stands if appropriate for [facility] date- please update PV as soon as the available date is known- if not, PV appt will need to be cancelled/changed; Dottie, Please let me know what the plan is- thank you

## 2023-05-20 NOTE — Telephone Encounter (Signed)
Summer Cooper,   This pt is a documented difficult intubation and her procedure will need to be done at the hospital.   Thanks,  Cathlyn Parsons

## 2023-05-20 NOTE — Telephone Encounter (Signed)
Please see message below from Dr. Doy Hutching.  Will you please schedule this at the hospital. Thank you

## 2023-05-23 ENCOUNTER — Other Ambulatory Visit: Payer: Self-pay | Admitting: *Deleted

## 2023-05-23 DIAGNOSIS — Z1211 Encounter for screening for malignant neoplasm of colon: Secondary | ICD-10-CM

## 2023-05-23 NOTE — Telephone Encounter (Signed)
Patient has been scheduled for screening colonoscopy at [facility] on the first available date with Dr Doy Hutching, Thursday, 08/25/23. 730 am procedure, 600 am arrival.

## 2023-05-23 NOTE — Telephone Encounter (Signed)
Discussed difficult intubation history with patient as to why we have changed her procedure to hospital case. She verbalizes understanding and is in agreement with this plan.

## 2023-05-23 NOTE — Telephone Encounter (Signed)
Patient called to reschedule previsit however she was not aware of the procedure change, would like to speak with a nurse.

## 2023-05-24 NOTE — Telephone Encounter (Signed)
Noted on PV chart; PV chart moved to appropriate date;

## 2023-06-17 ENCOUNTER — Encounter: Payer: Medicare Other | Admitting: Pediatrics

## 2023-07-26 ENCOUNTER — Ambulatory Visit (AMBULATORY_SURGERY_CENTER): Payer: Medicare Other

## 2023-07-26 VITALS — Ht 59.5 in | Wt 122.0 lb

## 2023-07-26 DIAGNOSIS — Z1211 Encounter for screening for malignant neoplasm of colon: Secondary | ICD-10-CM

## 2023-07-26 MED ORDER — SUFLAVE 178.7 G PO SOLR: 1.0000 | Freq: Once | ORAL | 0 refills | Status: AC

## 2023-07-26 NOTE — Progress Notes (Signed)

## 2023-07-28 ENCOUNTER — Ambulatory Visit (INDEPENDENT_AMBULATORY_CARE_PROVIDER_SITE_OTHER): Payer: Medicare Other | Admitting: Podiatry

## 2023-07-28 ENCOUNTER — Encounter: Payer: Self-pay | Admitting: Podiatry

## 2023-07-28 ENCOUNTER — Ambulatory Visit (INDEPENDENT_AMBULATORY_CARE_PROVIDER_SITE_OTHER)

## 2023-07-28 DIAGNOSIS — Z9889 Other specified postprocedural states: Secondary | ICD-10-CM

## 2023-07-28 DIAGNOSIS — M2012 Hallux valgus (acquired), left foot: Secondary | ICD-10-CM

## 2023-07-28 NOTE — Progress Notes (Signed)
  Subjective:  Patient ID: Summer Cooper, female    DOB: 1950/12/20,  MRN: [id number]  Chief Complaint  Patient presents with   Routine Post Op    Return in about 3 months (around 07/27/2023) for follow up from left foot surgery (new xrays) and R ingrown toenail removal .     73 y.o. female returns for post-op check.  She returns for follow-up notes it is doing all right not having any pain, the ingrown toenails not bothering her anymore  Review of Systems: Negative except as noted in the HPI. Denies N/V/F/Ch.   Objective:   There were no vitals filed for this visit.  There is no height or weight on file to calculate BMI. Constitutional Well developed. Well nourished.  Vascular Foot warm and well perfused. Capillary refill normal to all digits.  Calf is soft and supple, no posterior calf or knee pain, negative Homans' sign  Neurologic Normal speech. Oriented to person, place, and time. Epicritic sensation to light touch grossly present bilaterally.  Dermatologic Incisions well-healed and not hypertrophic  Orthopedic: No pain to palpation.  No edema.  Dorsiflexion of the second toe has again improved with range of motion there is decent range of motion of the second MTP and first MTP with no pain   Multiple view plain film radiographs: Correction is maintained with adequate alignment, some degenerative changes in the first and second MTP joint Assessment:   1. Acquired hallux valgus of left foot    Plan:  Patient was evaluated and treated and all questions answered.  S/p foot surgery left -Position has improved significantly, she has good range of motion of the MTP joint and has asymptomatic degenerative changes noted on her x-ray.  May progress over time that would require further treatment.  But overall doing well and she will return to see me as needed.  Return if symptoms worsen or fail to improve.

## 2023-08-17 ENCOUNTER — Telehealth: Payer: Self-pay | Admitting: Gastroenterology

## 2023-08-17 NOTE — Telephone Encounter (Signed)
 Procedure:Colonoscopy Procedure date: 08/25/23 Procedure location: [facility] Arrival Time: 6:00 am Spoke with the patient Y/N: Yes Any prep concerns? No  Has the patient obtained the prep from the pharmacy ? Yes Do you have a care partner and transportation: Yes Any additional concerns? No

## 2023-08-18 ENCOUNTER — Encounter (HOSPITAL_COMMUNITY): Payer: Self-pay | Admitting: Pediatrics

## 2023-08-22 ENCOUNTER — Encounter: Payer: Self-pay | Admitting: Pediatrics

## 2023-08-24 NOTE — Anesthesia Preprocedure Evaluation (Addendum)
 Anesthesia Evaluation  Patient identified by MRN, date of birth, ID band Patient awake    Reviewed: Allergy & Precautions, NPO status , Patient's Chart, lab work & pertinent test results  History of Anesthesia Complications Negative for: history of anesthetic complications  Airway Mallampati: II  TM Distance: >3 FB Neck ROM: Full    Dental  (+) Dental Advisory Given, Caps   Pulmonary neg pulmonary ROS   breath sounds clear to auscultation       Cardiovascular hypertension, Pt. on medications (-) angina  Rhythm:Regular Rate:Normal  '24 ECHO :EF 60-65%, normal LVF, mod AI, mild TR   Neuro/Psych  Headaches    GI/Hepatic Neg liver ROS,GERD  Controlled,,  Endo/Other  negative endocrine ROS    Renal/GU negative Renal ROS     Musculoskeletal  (+) Arthritis ,    Abdominal   Peds  Hematology negative hematology ROS (+)   Anesthesia Other Findings   Reproductive/Obstetrics                             Anesthesia Physical Anesthesia Plan  ASA: 2  Anesthesia Plan: MAC   Post-op Pain Management: Minimal or no pain anticipated   Induction:   PONV Risk Score and Plan: 2 and Treatment may vary due to age or medical condition  Airway Management Planned: Natural Airway and Simple Face Mask  Additional Equipment: None  Intra-op Plan:   Post-operative Plan:   Informed Consent: I have reviewed the patients History and Physical, chart, labs and discussed the procedure including the risks, benefits and alternatives for the proposed anesthesia with the patient or authorized representative who has indicated his/her understanding and acceptance.     Dental advisory given  Plan Discussed with: CRNA and Surgeon  Anesthesia Plan Comments:        Anesthesia Quick Evaluation

## 2023-08-25 ENCOUNTER — Ambulatory Visit (HOSPITAL_COMMUNITY): Admitting: Certified Registered"

## 2023-08-25 ENCOUNTER — Other Ambulatory Visit: Payer: Self-pay

## 2023-08-25 ENCOUNTER — Encounter (HOSPITAL_COMMUNITY): Payer: Self-pay | Admitting: Pediatrics

## 2023-08-25 ENCOUNTER — Ambulatory Visit (HOSPITAL_BASED_OUTPATIENT_CLINIC_OR_DEPARTMENT_OTHER): Admitting: Certified Registered"

## 2023-08-25 ENCOUNTER — Ambulatory Visit (HOSPITAL_COMMUNITY)
Admission: RE | Admit: 2023-08-25 | Discharge: 2023-08-25 | Disposition: A | Discharge status: Home / Self Care | Payer: Medicare Other | Attending: Pediatrics | Admitting: Pediatrics

## 2023-08-25 ENCOUNTER — Encounter (HOSPITAL_COMMUNITY)
Admission: RE | Disposition: A | Discharge status: Home / Self Care | Payer: Self-pay | Source: Home / Self Care | Attending: Pediatrics

## 2023-08-25 DIAGNOSIS — Z1211 Encounter for screening for malignant neoplasm of colon: Secondary | ICD-10-CM

## 2023-08-25 DIAGNOSIS — K219 Gastro-esophageal reflux disease without esophagitis: Secondary | ICD-10-CM | POA: Diagnosis not present

## 2023-08-25 DIAGNOSIS — I10 Essential (primary) hypertension: Secondary | ICD-10-CM | POA: Insufficient documentation

## 2023-08-25 DIAGNOSIS — K5730 Diverticulosis of large intestine without perforation or abscess without bleeding: Secondary | ICD-10-CM | POA: Insufficient documentation

## 2023-08-25 DIAGNOSIS — M1990 Unspecified osteoarthritis, unspecified site: Secondary | ICD-10-CM | POA: Diagnosis not present

## 2023-08-25 DIAGNOSIS — K649 Unspecified hemorrhoids: Secondary | ICD-10-CM | POA: Insufficient documentation

## 2023-08-25 HISTORY — PX: COLONOSCOPY WITH PROPOFOL: SHX5780

## 2023-08-25 SURGERY — COLONOSCOPY WITH PROPOFOL
Anesthesia: Monitor Anesthesia Care

## 2023-08-25 MED ORDER — SODIUM CHLORIDE 0.9 % IV SOLN
INTRAVENOUS | Status: DC
Start: 2023-08-25 — End: 2023-08-25

## 2023-08-25 MED ORDER — PROPOFOL 10 MG/ML IV BOLUS
INTRAVENOUS | Status: AC
  Filled 2023-08-25: qty 20

## 2023-08-25 MED ORDER — PROPOFOL 500 MG/50ML IV EMUL
INTRAVENOUS | Status: DC | PRN
  Administered 2023-08-25: 150 ug/kg/min via INTRAVENOUS

## 2023-08-25 MED ORDER — PROPOFOL 10 MG/ML IV BOLUS
INTRAVENOUS | Status: DC | PRN
  Administered 2023-08-25 (×2): 20 mg via INTRAVENOUS
  Administered 2023-08-25: 30 mg via INTRAVENOUS

## 2023-08-25 MED ORDER — PROPOFOL 1000 MG/100ML IV EMUL
INTRAVENOUS | Status: AC
  Filled 2023-08-25: qty 100

## 2023-08-25 SURGICAL SUPPLY — 17 items
ELECTRODE REM PT RTRN 9FT ADLT (ELECTROSURGICAL) IMPLANT
FORCEPS BIOP RAD 4 LRG CAP 4 (CUTTING FORCEPS) IMPLANT
FORCEPS BXJMBJMB 240X2.8X (CUTTING FORCEPS) IMPLANT
INJECTOR/SNARE I SNARE (MISCELLANEOUS) IMPLANT
LUBRICANT JELLY 4.5OZ STERILE (MISCELLANEOUS) IMPLANT
MANIFOLD NEPTUNE II (INSTRUMENTS) IMPLANT
NDL SCLEROTHERAPY 25GX240 (NEEDLE) IMPLANT
NEEDLE SCLEROTHERAPY 25GX240 (NEEDLE) IMPLANT
PAD FLOOR 36X40 (MISCELLANEOUS) ×1 IMPLANT
PROBE APC STR FIRE (PROBE) IMPLANT
PROBE INJECTION GOLD 7FR (MISCELLANEOUS) IMPLANT
SNARE ROTATE MED OVAL 20MM (MISCELLANEOUS) IMPLANT
SYR 50ML LL SCALE MARK (SYRINGE) IMPLANT
TRAP SPECIMEN MUCOUS 40CC (MISCELLANEOUS) IMPLANT
TUBING ENDO SMARTCAP PENTAX (MISCELLANEOUS) IMPLANT
TUBING IRRIGATION ENDOGATOR (MISCELLANEOUS) ×1 IMPLANT
WATER STERILE IRR 1000ML POUR (IV SOLUTION) IMPLANT

## 2023-08-25 NOTE — Anesthesia Procedure Notes (Signed)
 Procedure Name: MAC Date/Time: 08/25/2023 7:31 AM  Performed by: Ulanda Gambles, CRNAPre-anesthesia Checklist: Patient identified, Emergency Drugs available, Suction available and Patient being monitored Oxygen Delivery Method: Simple face mask

## 2023-08-25 NOTE — Op Note (Signed)
 [facility] Patient Name: Summer Cooper Procedure Date: 08/25/2023 MRN: [id number] Attending MD: Eugenia Hess , MD, [id number] Date of Birth: 1951-02-06 CSN: [id number] Age: 73 Admit Type: Outpatient Procedure:                Colonoscopy Indications:              Screening for colorectal malignant neoplasm, Last                            colonoscopy: 2014 Providers:                Eugenia Hess, MD, Lonzell Robin, RN, Alfredia Ina, Technician Referring MD:              Medicines:                Monitored Anesthesia Care Complications:            No immediate complications. Estimated blood loss:                            None. Estimated Blood Loss:     Estimated blood loss: none. Procedure:                Pre-Anesthesia Assessment:                           - Prior to the procedure, a History and Physical                            was performed, and patient medications and                            allergies were reviewed. The patient's tolerance of                            previous anesthesia was also reviewed. The risks                            and benefits of the procedure and the sedation                            options and risks were discussed with the patient.                            All questions were answered, and informed consent                            was obtained. Prior Anticoagulants: The patient has                            taken no anticoagulant or antiplatelet agents. ASA                            Grade Assessment: III - A patient with  severe                            systemic disease. After reviewing the risks and                            benefits, the patient was deemed in satisfactory                            condition to undergo the procedure.                           After obtaining informed consent, the colonoscope                            was passed under direct vision. Throughout the                             procedure, the patient's blood pressure, pulse, and                            oxygen saturations were monitored continuously. The                            PCF-HQ190L ([id number]) Olympus colonoscope was                            introduced through the anus and advanced to the                            cecum, identified by appendiceal orifice and                            ileocecal valve. The colonoscopy was performed                            without difficulty. The patient tolerated the                            procedure well. The quality of the bowel                            preparation was adequate. The ileocecal valve,                            appendiceal orifice, and rectum were photographed. Scope In: 7:34:37 AM Scope Out: 7:55:47 AM Scope Withdrawal Time: 0 hours 10 minutes 44 seconds  Total Procedure Duration: 0 hours 21 minutes 10 seconds  Findings:      The perianal and digital rectal examinations were normal. Pertinent       negatives include normal sphincter tone and no palpable rectal lesions.      A few small-mouthed diverticula were found in the sigmoid colon and       descending colon.      There were scattered stool balls present in the rectosigmoid colon.  These were lavaged with sterile water  and adequately moved to survey the       underlying mucosa.      The colon (entire examined portion) appeared normal.      Hemorrhoids were found during retroflexion. Impression:               - Diverticulosis in the sigmoid colon and in the                            descending colon.                           - The entire examined colon is normal.                           - Hemorrhoids.                           - No specimens collected. Moderate Sedation:      Not Applicable - Patient had care per Anesthesia. Recommendation:           - Discharge patient to home (ambulatory).                           - Based upon age and normal  findings on today's                            colonoscopy, recommend ceasing further screening                            colonoscopies.                           - The findings and recommendations were discussed                            with the patient's family.                           - Return to referring physician.                           - Patient has a contact number available for                            emergencies. The signs and symptoms of potential                            delayed complications were discussed with the                            patient. Return to normal activities tomorrow.                            Written discharge instructions were provided to the  patient. Procedure Code(s):        --- Professional ---                           [phone], Colonoscopy, flexible; diagnostic, including                            collection of specimen(s) by brushing or washing,                            when performed (separate procedure) Diagnosis Code(s):        --- Professional ---                           Z12.11, Encounter for screening for malignant                            neoplasm of colon                           K64.9, Unspecified hemorrhoids                           K57.30, Diverticulosis of large intestine without                            perforation or abscess without bleeding CPT copyright 2022 [employer]. All rights reserved. The codes documented in this report are preliminary and upon coder review may  be revised to meet current compliance requirements. Eugenia Hess, MD 08/25/2023 8:00:51 AM This report has been signed electronically. Number of Addenda: 0

## 2023-08-25 NOTE — Anesthesia Postprocedure Evaluation (Signed)
 Anesthesia Post Note  Patient: Summer Cooper  Procedure(s) Performed: COLONOSCOPY WITH PROPOFOL      Patient location during evaluation: Endoscopy Anesthesia Type: MAC Level of consciousness: awake and alert, patient cooperative and oriented Pain management: pain level controlled Vital Signs Assessment: post-procedure vital signs reviewed and stable Respiratory status: spontaneous breathing, nonlabored ventilation and respiratory function stable Cardiovascular status: stable and blood pressure returned to baseline Postop Assessment: no apparent nausea or vomiting Anesthetic complications: no   No notable events documented.  Last Vitals:  Vitals:   08/25/23 0810 08/25/23 0815  BP: (!) 143/63   Pulse: 76 79  Resp: 19 17  Temp:    SpO2: 100% 97%    Last Pain:  Vitals:   08/25/23 0810  TempSrc:   PainSc: 3                  Summer Cooper,E. Neidra Girvan

## 2023-08-25 NOTE — Transfer of Care (Signed)
 Immediate Anesthesia Transfer of Care Note  Patient: Summer Cooper  Procedure(s) Performed: COLONOSCOPY WITH PROPOFOL   Patient Location: PACU  Anesthesia Type:MAC  Level of Consciousness: drowsy  Airway & Oxygen Therapy: Patient Spontanous Breathing and Patient connected to face mask oxygen  Post-op Assessment: Report given to RN, Post -op Vital signs reviewed and stable, and Patient moving all extremities X 4  Post vital signs: Reviewed and stable  Last Vitals:  Vitals Value Taken Time  BP 137/86   Temp    Pulse 78 08/25/23 0802  Resp 17 08/25/23 0802  SpO2 100 % 08/25/23 0802  Vitals shown include unfiled device data.  Last Pain:  Vitals:   08/25/23 0704  TempSrc: Temporal  PainSc: 0-No pain         Complications: No notable events documented.

## 2023-08-25 NOTE — Discharge Instructions (Signed)

## 2023-08-25 NOTE — H&P (Signed)
 [facility] History and Physical   Primary Care Physician:  Faustina Hood, MD   Reason for Procedure:  Colorectal cancer screening  Plan:    Screening colonoscopy     HPI: Summer Cooper is a 73 y.o. female undergoing screening colonoscopy for colorectal cancer screening.  Patient reports that last colonoscopy was performed in 2014 and was normal.  No family history of colorectal cancer or polyps.  Patient denies rectal bleeding or change in bowel habits.   Past Medical History:  Diagnosis Date   Allergy    Arthritis    Carpal tunnel syndrome    in both wrists   Cataracts, bilateral    mild, no surgery yet   Complication of anesthesia    hard to wake up (DURING TUBAL LIGATION.  OTHER SURGERIES WERE FINE)   Headache    last flare up 04/06/2017   Hearing loss    mild case - no aids used   Heart murmur    HTN (hypertension) 11/07/2018   Hyperlipidemia    Seasonal allergies     Past Surgical History:  Procedure Laterality Date   BUNIONECTOMY  2011   COLONOSCOPY     I & D EXTREMITY     cat bite-rt wrist   JOINT REPLACEMENT     REVERSE SHOULDER ARTHROPLASTY Left 12/22/2018   Procedure: REVERSE SHOULDER ARTHROPLASTY;  Surgeon: Winston Hawking, MD;  Location: [facility];  Service: Orthopedics;  Laterality: Left;  interscalene block   TARSAL METATARSAL ARTHRODESIS  05/13/2011   Procedure: TARSAL METATARSAL FUSION;  Surgeon: Amada Backer, MD;  Location: [facility];  Service: Orthopedics;  Laterality: Right;  right Lapidus 1st tarsal metatarsal arthrodesis, right 2nd hammer toe correction, mcbride bunionectomy, 2nd metatarsal weil osteotomy   TONSILLECTOMY     TOTAL SHOULDER ARTHROPLASTY Right 05/27/2017   Procedure: RIGHT TOTAL SHOULDER ARTHROPLASTY;  Surgeon: Winston Hawking, MD;  Location: [facility];  Service: Orthopedics;  Laterality: Right;   TUBAL LIGATION      Prior to Admission medications   Medication Sig Start Date End Date Taking? Authorizing  Provider  acetaminophen  (TYLENOL ) 500 MG tablet Take 1,000 mg by mouth every 8 (eight) hours as needed for headache.   Yes [provider]  cetirizine (ZYRTEC) 10 MG tablet Take 10 mg by mouth daily as needed for allergies.   Yes [provider]  diclofenac  (VOLTAREN ) 75 MG EC tablet Take 75 mg by mouth daily.    Yes [provider]  diclofenac  sodium (VOLTAREN ) 1 % GEL Place 2 g onto the skin 4 (four) times daily as needed (pain).  08/05/11  Yes [provider]  fluticasone  (FLONASE ) 50 MCG/ACT nasal spray Place 2 sprays into both nostrils daily as needed for allergies or rhinitis.   Yes [provider]  hydrochlorothiazide  (MICROZIDE ) 12.5 MG capsule Take 12.5 mg by mouth daily.   Yes [provider]  lisinopril  (PRINIVIL ,ZESTRIL ) 5 MG tablet Take 5 mg by mouth daily.   Yes [provider]  Multiple Vitamins-Minerals (MULTI FOR HER 50+) TABS Take 1 tablet by mouth See admin instructions.   Yes [provider]  polyethylene glycol powder (GLYCOLAX /MIRALAX ) 17 GM/SCOOP powder See admin instructions.   Yes [provider]  rosuvastatin  (CRESTOR ) 10 MG tablet Take 1 tablet (10 mg total) by mouth daily. 10/19/22 08/25/23 Yes Tolia, Sunit, DO  SUMAtriptan  (IMITREX ) 50 MG tablet Take 50 mg by mouth every 2 (two) hours as needed for migraine.    Yes [provider]  ondansetron  (ZOFRAN ) 4 MG tablet Take 4 mg by mouth every 8 (eight) hours as needed for nausea or vomiting.    [provider]  traMADol  (ULTRAM ) 50 MG tablet Take 50 mg by mouth every 6 (six) hours as needed. 06/16/21   [provider]    Current Facility-Administered Medications  Medication Dose Route Frequency Provider Last Rate Last Admin   0.9 %  sodium chloride  infusion   Intravenous Continuous Alejandra Hunt, Scarlette Currier, MD        Allergies as of 05/23/2023   (No Known Allergies)    Family History  Problem Relation Age of Onset   Heart  failure Mother    Heart disease Mother    Rheumatic fever Father    Heart attack Father    Heart disease Father    Colon cancer Neg Hx    Colon polyps Neg Hx    Esophageal cancer Neg Hx    Rectal cancer Neg Hx    Stomach cancer Neg Hx     Social History   Socioeconomic History   Marital status: Married    Spouse name: Not on file   Number of children: 1   Years of education: Not on file   Highest education level: Not on file  Occupational History   Not on file  Tobacco Use   Smoking status: Never   Smokeless tobacco: Never  Vaping Use   Vaping status: Never Used  Substance and Sexual Activity   Alcohol use: Yes    Alcohol/week: 3.0 - 4.0 standard drinks of alcohol    Types: 3 - 4 Glasses of wine per week    Comment: occ   Drug use: No   Sexual activity: Not on file  Other Topics Concern   Not on file  Social History Narrative   Not on file   Social Drivers of Health   Financial Resource Strain: Not on file  Food Insecurity: Not on file  Transportation Needs: Not on file  Physical Activity: Not on file  Stress: Not on file  Social Connections: Not on file  Intimate Partner Violence: Not on file    Review of Systems:  All other review of systems negative except as mentioned in the HPI.  Physical Exam: Vital signs BP (!) 137/94   Pulse 96   Temp (!) 97.4 F (36.3 C) (Temporal)   Resp 17   Ht 4\' 11"  (1.499 m)   Wt 54.9 kg   SpO2 96%   BMI 24.44 kg/m   General:   Alert,  Well-developed, well-nourished, pleasant and cooperative in NAD Lungs:  Clear throughout to auscultation.   Heart:  Regular rate and rhythm; no murmurs, clicks, rubs,  or gallops. Abdomen:  Soft, nontender and nondistended. Normal bowel sounds.   Neuro/Psych:  Normal mood and affect. A and O x 3  Eugenia Hess, MD [facility]

## 2023-08-28 ENCOUNTER — Encounter (HOSPITAL_COMMUNITY): Payer: Self-pay | Admitting: Pediatrics

## 2023-09-09 ENCOUNTER — Telehealth: Payer: Self-pay | Admitting: Pediatrics

## 2023-09-09 NOTE — Telephone Encounter (Signed)
 Summer Cooper is asking who prescribed the colon prep and where was it sent.  I reviewed the chart notes.  The patient hasn't been seen in office recently and was supposed to have a previsit.  I couldn't find the previsit note and the prescription.  I advised Summer Cooper that I will have to get back to her once I have had a chance to review the chart completely and speak with the nurses who handled this for the doctor.

## 2023-09-09 NOTE — Telephone Encounter (Signed)
 Inbound call from patient's Medicare part B drug plan, Summer Cooper, requesting a call to receive pharmacy information for hen patient's prep medication was called in for 5/1 colonoscopy at [facility]. States patient submitted a receipt but no pharmacy information was listed. Requesting a call back with information at [phone]. Please advise, thank you.

## 2023-09-20 NOTE — Telephone Encounter (Signed)
 I left a detailed message for Summer Cooper advising that the colonoscopy prep was sent to [facility].

## 2023-12-09 ENCOUNTER — Other Ambulatory Visit (HOSPITAL_COMMUNITY): Payer: Self-pay

## 2024-01-04 ENCOUNTER — Other Ambulatory Visit (HOSPITAL_COMMUNITY): Payer: Self-pay

## 2024-01-19 ENCOUNTER — Other Ambulatory Visit (HOSPITAL_COMMUNITY): Payer: Self-pay

## 2024-01-19 MED ORDER — FLUZONE HIGH-DOSE 0.5 ML IM SUSY
0.5000 mL | PREFILLED_SYRINGE | Freq: Once | INTRAMUSCULAR | 0 refills | Status: AC
  Filled 2024-01-19: qty 0.5, 1d supply, fill #0

## 2024-01-26 ENCOUNTER — Other Ambulatory Visit (HOSPITAL_COMMUNITY): Payer: Self-pay

## 2024-02-03 ENCOUNTER — Other Ambulatory Visit: Payer: Self-pay

## 2024-02-27 ENCOUNTER — Ambulatory Visit (HOSPITAL_COMMUNITY)
Admission: RE | Admit: 2024-02-27 | Discharge: 2024-02-27 | Disposition: A | Discharge status: Home / Self Care | Payer: Medicare Other | Source: Ambulatory Visit | Attending: Cardiology | Admitting: Cardiology

## 2024-02-27 ENCOUNTER — Ambulatory Visit (HOSPITAL_COMMUNITY)
Admission: RE | Admit: 2024-02-27 | Discharge: 2024-02-27 | Disposition: A | Payer: Medicare Other | Source: Ambulatory Visit | Attending: Cardiology

## 2024-02-27 DIAGNOSIS — I77810 Thoracic aortic ectasia: Secondary | ICD-10-CM | POA: Insufficient documentation

## 2024-02-27 DIAGNOSIS — I10 Essential (primary) hypertension: Secondary | ICD-10-CM | POA: Diagnosis not present

## 2024-02-27 DIAGNOSIS — I351 Nonrheumatic aortic (valve) insufficiency: Secondary | ICD-10-CM | POA: Diagnosis present

## 2024-02-27 DIAGNOSIS — E785 Hyperlipidemia, unspecified: Secondary | ICD-10-CM | POA: Insufficient documentation

## 2024-02-27 LAB — ECHOCARDIOGRAM COMPLETE
Area-P 1/2: 4.21 cm2
P 1/2 time: 372 ms
S' Lateral: 3.1 cm

## 2024-02-28 ENCOUNTER — Telehealth: Payer: Self-pay | Admitting: Cardiology

## 2024-02-28 NOTE — Telephone Encounter (Signed)
 Pt called to set up appt. Advised pt we would call with results once Dr Michele reviewed the tests.

## 2024-02-28 NOTE — Telephone Encounter (Signed)
 Pt requesting c/b for test results. Please advise.

## 2024-04-04 ENCOUNTER — Ambulatory Visit: Payer: Self-pay | Admitting: Cardiology

## 2024-04-16 NOTE — Telephone Encounter (Signed)
 Spoke with patient. Relayed Dr. Tyree note. All concerns addressed.

## 2024-05-01 ENCOUNTER — Encounter: Payer: Self-pay | Admitting: Cardiology

## 2024-05-01 ENCOUNTER — Ambulatory Visit: Attending: Cardiology | Admitting: Cardiology

## 2024-05-01 VITALS — BP 130/80 | HR 78 | Resp 16 | Ht 59.0 in | Wt 128.0 lb

## 2024-05-01 DIAGNOSIS — E782 Mixed hyperlipidemia: Secondary | ICD-10-CM

## 2024-05-01 DIAGNOSIS — I700 Atherosclerosis of aorta: Secondary | ICD-10-CM | POA: Diagnosis not present

## 2024-05-01 DIAGNOSIS — I351 Nonrheumatic aortic (valve) insufficiency: Secondary | ICD-10-CM | POA: Diagnosis not present

## 2024-05-01 DIAGNOSIS — I10 Essential (primary) hypertension: Secondary | ICD-10-CM | POA: Diagnosis not present

## 2024-05-01 DIAGNOSIS — I77810 Thoracic aortic ectasia: Secondary | ICD-10-CM

## 2024-05-01 NOTE — Progress Notes (Signed)
 " Cardiology Office Note:  .   Date:  05/01/2024  ID:  Summer Cooper, DOB 04-18-1951, MRN [id number] PCP:  Claudene Pellet, MD  Former Cardiology Providers: Emmalene Lawrence, APRN, FNP-C  [facility] Providers Cardiologist:  Madonna Large, DO , [facility] (established care March 2024) Electrophysiologist:  None  Click to update primary MD,subspecialty MD or APP then REFRESH:1}    Chief Complaint  Patient presents with   Ascending aorta dilatation   Follow-up    Aortic regurgitation    History of Present Illness: .   Summer Cooper is a 74 y.o. Caucasian female whose past medical history and cardiovascular risk factors includes: Ascending aortic aneurysm (42 mm, 07/2022), aortic atherosclerosis, hypertension, history of migraine headaches, osteoarthritis, moderate AR, postmenopausal female, advanced age.   She works as a engineer, water. Previously worked as a engineer, civil (consulting) for [employer].    Patient is being followed by the practice for aortic regurgitation & ascending aortic aneurysm.   During her coronary calcium  score patient was noted to have an ascending aortic dilatation of 42mm.  And follow-up echocardiograms noted aortic regurgitation to be at least moderate in severity.  In the past we discussed the role of transesophageal echocardiogram to further evaluate the severity of aortic regurgitation and also to better evaluate the ascending aorta.  However she wanted to hold off on invasive procedures and a shared decision was to proceed with cardiac MRI.  She had a cardiac MRI in November 2024 which noted ascending aorta to be 40 mm in dimension and the severity of AI was reported to be mild.    Since last office visit patient has been experiencing gradual onset of shortness of breath with walking uphill otherwise no chest pain.  Her echocardiogram in November 2025 notes aortic regurgitation to be moderate to severe in LVEF trending lower when compared to prior studies.  Patient  denies near-syncope or syncopal events.  No heart failure symptoms.  She continues to work regularly.  Review of Systems: .   Review of Systems  Cardiovascular:  Positive for dyspnea on exertion (more noticable with uphill walking). Negative for chest pain, claudication, irregular heartbeat, leg swelling, near-syncope, orthopnea, palpitations, paroxysmal nocturnal dyspnea and syncope.  Respiratory:  Negative for shortness of breath.   Hematologic/Lymphatic: Negative for bleeding problem.    Studies Reviewed:   EKG: EKG Interpretation Date/Time:  Tuesday May 01 2024 11:54:49 EST Ventricular Rate:  78 PR Interval:  126 QRS Duration:  94 QT Interval:  402 QTC Calculation: 458 R Axis:   3  Text Interpretation: Normal sinus rhythm Low voltage QRS When compared with ECG of 28-Apr-2023 11:30, No significant change was found Confirmed by Large Madonna [phone]) on 05/01/2024 12:19:50 PM  Echocardiogram: 09/28/2022:  Normal LV systolic function with visual EF 60-65%. Left ventricle cavity is normal in size. Normal left ventricular wall thickness. Normal global  wall motion. Normal diastolic filling pattern, normal LAP.  Moderate (Grade III) aortic regurgitation.  Mild tricuspid regurgitation. No evidence of pulmonary hypertension.  The aortic root (35mm) and proximal ascending aorta (36mm) measure within normal limits.  Compared to 07/14/2021 Grade 1 diastolic dysfunction is now normal, AR remains same, otherwise no significant change.   02/27/2024 LVEF: 50-55% Diastolic Function: Grade 1 diastolic dysfunction Right ventricle: Size and function normal Regurgitation: Moderate to severe AR Stenosis: No significant Aorta: Ascending aorta 40 mm Estimated RAP 15 mmHg See report for additional details  Stress Testing: 07/04/2019: Myocardial perfusion is normal, LVEF by  gated SPECT 55%.  Low risk study  Coronary calcium  score 08/14/2021 1. No coronary atherosclerotic calcifications. The  observed calcium  score of 0  2. Fusiform ascending thoracic aortic aneurysm measuring up to 4.2 cm.  See report for additional details  Cardiac MRI November 2024: 1.  Dilated ascending thoracic aorta 4.0 cm   2. Central AR mild by RF only 6% and vena contracta width in LVOT only 25% RV < 10 cc   3.  Normal LV size and function LVEF 57% ESV 60 cc   4.  Normal RV size and function RVEF 57%   5.  No delayed gadolinium enhancement   6.  Normal parametric measures see values above   7.  Estimated cardiac output 5.5 L/min  RADIOLOGY: CT chest without contrast 08/15/2022: 1. Mild fusiform dilatation of the ascending aorta, maximal dimension 4.2 cm. This is unchanged from prior exam. Recommend annual imaging follow-up. 2. No acute intrathoracic abnormality. Aortic Atherosclerosis (ICD10-I70.0).  CT chest without contrast 02/2024 Dilated ascending aorta measuring 41 mm, stable compared to prior CT on 08/12/2022.Recommend annual imaging followup by CTA or MRA.  Risk Assessment/Calculations:   N/A   Labs:    External Labs: 05/29/2018: Cholesterol 253, triglycerides 134, HDL 105, LDL 121. June 26, 2019: Total cholesterol 226, triglycerides 70, HDL 101, LDL 113  Lipid Panel w/reflex   2021-05-18    Cholesterol 259   <200  CHOL/HDL 2.3   2.0-4.0  HDLD 114   30-85  Triglyceride 106   0-199  NHDL 145   0-129  LDL Chol Calc (NIH) 127   9-00  LDL Chol Calc (NIH) 127   9-00   Thy[facility] Reviewed date:12/29/2022 08:11:10 AM Interpretation:Normal range Performing Lab: Notes/Report: Testing Performed at: [facility], [address], [address], [place], [place] [address]  TSH 2.97 0.34-4.50 UlU/mL    Lipid Panel w/reflex Reviewed date:12/29/2022 08:11:10 AM Interpretation:LDL 84 Performing Lab: Notes/Report: Testing Performed at: [facility], [address], [address], [place], [place] [address]  Cholesterol 191 <200 mg/dL    CHOL/HDL 2.2 7.9-5.9 Ratio    HDLD 88  30-85 mg/dL Values below 40 mg/dL indicate increased risk factor  Triglyceride 115 0-199 mg/dL    NHDL 896 9-870 mg/dL Range dependent upon risk factors.  LDL Chol Calc (NIH) 84 0-99 mg/dL     External Labs: Collected: January 23, 2024 [facility] database. Total cholesterol 190, triglycerides 112, HDL 106, LDL 65. Hemoglobin 12.9. Potassium 4.0. TSH 1.9   Physical Exam:    Today's Vitals   05/01/24 1151  BP: 130/80  Pulse: 78  Resp: 16  SpO2: 95%  Weight: 128 lb (58.1 kg)  Height: 4' 11 (1.499 m)   Body mass index is 25.85 kg/m. Wt Readings from Last 3 Encounters:  05/01/24 128 lb (58.1 kg)  08/25/23 121 lb (54.9 kg)  07/26/23 122 lb (55.3 kg)    Physical Exam  Constitutional: No distress.  hemodynamically stable  Neck: No JVD present.  Cardiovascular: Normal rate, regular rhythm, S1 normal and S2 normal. Exam reveals no gallop, no S3 and no S4.  Murmur heard. High-pitched decrescendo early diastolic murmur is present with a grade of 2/4 at the upper right sternal border radiating to the apex. Pulmonary/Chest: Effort normal and breath sounds normal. No stridor. She has no wheezes. She has no rales.  Abdominal: Soft. Bowel sounds are normal. She exhibits no distension. There is no abdominal tenderness.  Musculoskeletal:        General:  No edema.     Cervical back: Neck supple.  Neurological: She is alert and oriented to person, place, and time. She has intact cranial nerves (2-12).  Skin: Skin is warm.   Impression & Recommendation(s):  Impression:   ICD-10-CM   1. Ascending aorta dilatation  I77.810 EKG 12-Lead    2. Nonrheumatic aortic valve insufficiency  I35.1     3. Essential hypertension  I10     4. Mixed hyperlipidemia  E78.2     5. Aortic atherosclerosis  I70.0        Recommendation(s):  Ascending aorta dilatation (HCC) Aortic regurgitation Dimensions of the ascending aorta remains relatively stable between 40-42 mm. Most recent echocardiogram from  November 2025 notes a moderate to severe AI.Images independently reviewed.   Recent echocardiogram from November 2025 reports LVEF at 50-55%, independent review of the images suggestive of LVEF of 55 to 60% with moderate to severe AI with eccentric jet. I reviewed the images of the echocardiogram with the patient for further understanding and clarity. Given the fact that she is experiencing shortness of breath with exertion, downtrend of LVEF, and moderate to severe aortic regurgitation recommended transesophageal echocardiogram for further evaluation. Risks and benefits, and alternatives to transesophageal echocardiogram discussed. Alternatives include cardiac MRI or repeating a transthoracic echocardiogram as a 26-month follow-up study to reevaluate progression. Patient will reconsider her options and call us  back with her decision. She is more than welcome to also seek a second opinion with regards to her valvular heart disease. Her questions and concerns were addressed to her satisfaction.  Images independently reviewed with her during today's encounter as well. There is good correlation between echo and CT chest regarding Ascending aorta measurement so we can follow echocardiograms for now to minimize radiation and healthcare cost.   Essential hypertension Office blood pressures are well-controlled. Continue lisinopril  5 mg p.o. daily. Continue hydrochlorothiazide  12.5 mg p.o. daily  Mixed hyperlipidemia Currently on rosuvastatin  10 mg p.o. daily.   She denies myalgia or other side effects. Most recent lipids dated January 23, 2024 independently reviewed as noted above. LDL levels have improved, now at 65 mg/dL when compared to prior studies  Orders Placed:  Orders Placed This Encounter  Procedures   EKG 12-Lead   Final Medication List:   No orders of the defined types were placed in this encounter.   There are no discontinued medications.   Current Outpatient Medications:     acetaminophen  (TYLENOL ) 500 MG tablet, Take 1,000 mg by mouth every 8 (eight) hours as needed for headache., Disp: , Rfl:    cetirizine (ZYRTEC) 10 MG tablet, Take 10 mg by mouth daily as needed for allergies., Disp: , Rfl:    diclofenac  (VOLTAREN ) 75 MG EC tablet, Take 75 mg by mouth daily. , Disp: , Rfl:    diclofenac  sodium (VOLTAREN ) 1 % GEL, Place 2 g onto the skin 4 (four) times daily as needed (pain). , Disp: , Rfl:    fluticasone  (FLONASE ) 50 MCG/ACT nasal spray, Place 2 sprays into both nostrils daily as needed for allergies or rhinitis., Disp: , Rfl:    hydrochlorothiazide  (MICROZIDE ) 12.5 MG capsule, Take 12.5 mg by mouth daily., Disp: , Rfl:    lisinopril  (PRINIVIL ,ZESTRIL ) 5 MG tablet, Take 5 mg by mouth daily., Disp: , Rfl:    Multiple Vitamins-Minerals (MULTI FOR HER 50+) TABS, Take 1 tablet by mouth See admin instructions., Disp: , Rfl:    ondansetron  (ZOFRAN ) 4 MG tablet, Take 4 mg by mouth every  8 (eight) hours as needed for nausea or vomiting., Disp: , Rfl:    polyethylene glycol powder (GLYCOLAX /MIRALAX ) 17 GM/SCOOP powder, See admin instructions., Disp: , Rfl:    rosuvastatin  (CRESTOR ) 10 MG tablet, Take 1 tablet (10 mg total) by mouth daily., Disp: 90 tablet, Rfl: 3   SUMAtriptan  (IMITREX ) 50 MG tablet, Take 50 mg by mouth every 2 (two) hours as needed for migraine. , Disp: , Rfl:    traMADol  (ULTRAM ) 50 MG tablet, Take 50 mg by mouth every 6 (six) hours as needed., Disp: , Rfl:   Consent:   Informed Consent   Shared Decision Making/Informed Consent   The risks [esophageal damage, perforation (1:10,000 risk), bleeding, pharyngeal hematoma as well as other potential complications associated with conscious sedation including aspiration, arrhythmia, respiratory failure and death], benefits (treatment guidance and diagnostic support) and alternatives of a transesophageal echocardiogram were discussed in detail with Ms. Louks and she is willing to proceed.      Disposition:    3 months or sooner if needed  Her questions and concerns were addressed to her satisfaction. She voices understanding of the recommendations provided during this encounter.    Signed, Madonna Michele HAS, [facility]  A Division of [facility] [address] [place], [place] [address]  05/01/2024 1:26 PM "

## 2024-05-01 NOTE — Patient Instructions (Signed)
 Medication Instructions:  Your physician recommends that you continue on your current medications as directed. Please refer to the Current Medication list given to you today.  *If you need a refill on your cardiac medications before your next appointment, please call your pharmacy*  Lab Work: None ordered If you have labs (blood work) drawn today and your tests are completely normal, you will receive your results only by: MyChart Message (if you have MyChart) OR A paper copy in the mail If you have any lab test that is abnormal or we need to change your treatment, we will call you to review the results.  Testing/Procedures: None ordered  Follow-Up: At [facility], you and your health needs are our priority.  As part of our continuing mission to provide you with exceptional heart care, our providers are all part of one team.  This team includes your primary Cardiologist (physician) and Advanced Practice Providers or APPs (Physician Assistants and Nurse Practitioners) who all work together to provide you with the care you need, when you need it.  Your next appointment:   3 month(s)  Provider:   Madonna Large, DO    We recommend signing up for the patient portal called MyChart.  Sign up information is provided on this After Visit Summary.  MyChart is used to connect with patients for Virtual Visits (Telemedicine).  Patients are able to view lab/test results, encounter notes, upcoming appointments, etc.  Non-urgent messages can be sent to your provider as well.   To learn more about what you can do with MyChart, go to [email or web address].

## 2024-05-08 ENCOUNTER — Encounter: Payer: Self-pay | Admitting: Cardiology

## 2024-05-11 ENCOUNTER — Telehealth: Payer: Self-pay | Admitting: Cardiology

## 2024-05-11 NOTE — Telephone Encounter (Signed)
 See MyChart message--Patient is requesting a call back to schedule TEE.
